# Patient Record
Sex: Female | Born: 1975 | Race: White | Hispanic: No | Marital: Married | Smoking: Never smoker
Health system: Southern US, Community
[De-identification: ages and names within clinical notes are randomized; demographics above are authoritative.]

## PROBLEM LIST (undated history)

## (undated) DIAGNOSIS — S83241A Other tear of medial meniscus, current injury, right knee, initial encounter: Secondary | ICD-10-CM

## (undated) DIAGNOSIS — Z87448 Personal history of other diseases of urinary system: Secondary | ICD-10-CM

## (undated) HISTORY — PX: APPENDECTOMY: SHX54

## (undated) HISTORY — DX: Personal history of other diseases of urinary system: Z87.448

---

## 2007-07-02 ENCOUNTER — Inpatient Hospital Stay (HOSPITAL_COMMUNITY): Admission: AD | Admit: 2007-07-02 | Discharge: 2007-07-05 | Payer: Self-pay | Admitting: Obstetrics and Gynecology

## 2007-09-01 ENCOUNTER — Inpatient Hospital Stay (HOSPITAL_COMMUNITY): Admission: AD | Admit: 2007-09-01 | Discharge: 2007-09-04 | Payer: Self-pay | Admitting: Obstetrics and Gynecology

## 2007-09-02 ENCOUNTER — Encounter (INDEPENDENT_AMBULATORY_CARE_PROVIDER_SITE_OTHER): Payer: Self-pay | Admitting: Obstetrics and Gynecology

## 2010-03-17 ENCOUNTER — Inpatient Hospital Stay (HOSPITAL_COMMUNITY): Admission: AD | Admit: 2010-03-17 | Payer: Self-pay | Admitting: Obstetrics and Gynecology

## 2010-04-25 ENCOUNTER — Encounter: Payer: Self-pay | Admitting: Family Medicine

## 2010-04-25 ENCOUNTER — Ambulatory Visit (INDEPENDENT_AMBULATORY_CARE_PROVIDER_SITE_OTHER): Payer: Commercial Managed Care - PPO | Admitting: Family Medicine

## 2010-04-25 VITALS — BP 122/75 | HR 101 | Wt 169.2 lb

## 2010-04-25 DIAGNOSIS — J029 Acute pharyngitis, unspecified: Secondary | ICD-10-CM | POA: Insufficient documentation

## 2010-04-25 MED ORDER — AMOXICILLIN 875 MG PO TABS: 875.0000 mg | ORAL_TABLET | Freq: Two times a day (BID) | ORAL | Status: AC

## 2010-04-25 NOTE — Patient Instructions (Signed)
Try the salt water gargles several times a day for relief.  Otherwise symptomatic treatment with Chloraseptic spray and throat drops are best.  I'll send in the Amoxicillin now.   If you symptoms haven't improved in 10 days, give [place] a call back. It was good to meet you!

## 2010-04-27 ENCOUNTER — Encounter: Payer: Self-pay | Admitting: Family Medicine

## 2010-04-27 NOTE — Assessment & Plan Note (Addendum)
Rapid strep negative, obtained throat culture.  Less likely to be post-nasal drip due to lack of sinus congestion. Not likely to be strep due to neg adenopathy, cough present.  However sore throat with exudate has persisted for greater than 1 week.   No pseudomembranes, up to date on vaccines so diphtheria unlikely.   Plan to treat with 1 week course of Amoxicillin despite negative strep.  Patient does have history of + rapid strep 1 year ago with similar presentation.   Gave red flags and reasons to fu/return to clinic.

## 2010-04-27 NOTE — Progress Notes (Signed)
  Subjective:    Patient ID: [name], female    DOB: [birth year], 35 y.o.   MRN: [id number]  HPI 1.  Sore throat:  35 yo G2P1001 who presents today with complaints of sore throat.  States that she has had cough for 1.5 weeks.  Developed sore throat 3 days ago.  Did not think much of it until looking in mirror and seeing exudate present.  Concerned for strep b/c she was diagnosed with strep throat about 1 year ago.  States that throat pain is present much of the time but worsens with swallowing.  No dysphagia or trouble with airway.  Cough basically nonproductive.  No fevers or chills.  No abdominal pain, N/V.   Review of Systems See HPI above for review of systems.       Objective:   Physical Exam Gen:  Alert, cooperative patient who appears stated age in no acute distress.  Vital signs reviewed. HEENT:  [place]/AT.  EOMI, PERRL.  Conjunctivae not injected.  MMM.  Tonsils with erythema and edema present.  Exudate present on Left tonsil that persists when patient swallows.    Neck:  No cervical adenopathy.   Pulm:  Clear to auscultation bilaterally with good air movement.  No wheezes or rales noted.   Cardiac:  Regular rate and rhythm without murmur auscultated.  Good S1/S2. Abd:  Fundal heights consistent with dates.  FHTs good.  Good bowel sounds.  No pain on palpation. Skin:  No rashes or lesions noted.     Assessment & Plan:

## 2010-04-29 ENCOUNTER — Encounter: Payer: Self-pay | Admitting: Family Medicine

## 2010-05-13 DIAGNOSIS — R05 Cough: Secondary | ICD-10-CM | POA: Insufficient documentation

## 2010-05-14 ENCOUNTER — Encounter: Payer: Self-pay | Admitting: Internal Medicine

## 2010-05-14 ENCOUNTER — Ambulatory Visit (INDEPENDENT_AMBULATORY_CARE_PROVIDER_SITE_OTHER)
Admission: RE | Admit: 2010-05-14 | Discharge: 2010-05-14 | Disposition: A | Discharge status: Home / Self Care | Payer: Commercial Managed Care - PPO | Source: Ambulatory Visit | Attending: Internal Medicine | Admitting: Internal Medicine

## 2010-05-14 ENCOUNTER — Other Ambulatory Visit: Payer: Self-pay | Admitting: Internal Medicine

## 2010-05-14 ENCOUNTER — Institutional Professional Consult (permissible substitution) (INDEPENDENT_AMBULATORY_CARE_PROVIDER_SITE_OTHER): Payer: Commercial Managed Care - PPO | Admitting: Internal Medicine

## 2010-05-14 DIAGNOSIS — R05 Cough: Secondary | ICD-10-CM

## 2010-05-14 DIAGNOSIS — R059 Cough, unspecified: Secondary | ICD-10-CM

## 2010-05-14 DIAGNOSIS — N390 Urinary tract infection, site not specified: Secondary | ICD-10-CM

## 2010-05-23 NOTE — Assessment & Plan Note (Signed)
Summary: Pulmonary consultaton/ cough ? all uacs   Visit Type:  Initial Consult Copy to:  Dr. Olivia Mackie Primary Provider/Referring Provider:  none  CC:  Cough.  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  Presently she denies any significant sore throat, dysphagia, itching, sneezing,   fever, chills, sweats, unintended wt loss, pleuritic or exertional cp, hempoptysis, change in activity tolerance  orthopnea pnd or leg swelling.   Pt also denies any obvious fluctuation in symptoms with weather or environmental change or other alleviating or aggravating factors.       Current Medications (verified): 1)  Macrobid 100 Mg Caps (Nitrofurantoin Monohyd Macro) .Marland Kitchen.. 1 Once Daily 2)  Prenatal Vitamins 0.8 Mg Tabs (Prenatal Multivit-Min-Fe-Fa) .Marland Kitchen.. 1 Once Daily 3)  Tylenol 325 Mg Tabs (Acetaminophen) .... Per Bottle Directions As Needed 4)  Tussionex Pennkinetic Er 10-8 Mg/34ml Lqcr (Hydrocod Polst-Chlorphen Polst) .Marland Kitchen.. 1 Tsp Two Times A Day As Needed  Allergies (verified): No Known Drug Allergies  Past History:  Past Medical History: Severe cough during 2nd pregnancy..............Marland KitchenWert Severe UTI during 2nd Pregnancy      - Macrodantin rx per Dr Rosemary Holms  Past Surgical History: Appendectomy 1993  Family History: Reviewed history from 05/13/2010 and no changes required. Emphysema- PGF Spinal cord CA- PGF Brain CA- PGM Prostate CA- MGF Heart dz- MGM and both Grandfathers TIA- MGF  Social History: Married Children Smoked briefly in college.  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  The patient denies shortness of breath with activity, shortness of breath at rest, coughing up blood, chest pain, irregular heartbeats, acid heartburn, loss of appetite, weight change, abdominal pain, difficulty swallowing, tooth/dental problems, sneezing, itching, ear ache, anxiety, depression, hand/feet swelling, joint stiffness or pain, rash, and fever.    Vital Signs:  Patient profile:   35 year old female Height:      66 inches Weight:      170 pounds BMI:     27.54 O2 Sat:      98 % on Room air Temp:     98.2 degrees F oral Pulse rate:   88 / minute BP sitting:   122 / 78  (left arm)  Vitals Entered By: Vernie Murders (May 14, 2010 9:52 AM)  O2 Flow:  Room air  Physical Exam  Additional Exam:  very healthy appearing amb wf with obvious nasal congestion and rattling cough/ pseudowheeze wt 170 May 14, 2010 HEENT: nl dentition,  and orophanx. Nl external ear canals without cough reflex NECK :  without JVD/Nodes/TM/ nl carotid upstrokes bilaterally LUNGS: no acc muscle use, clear to A and P bilaterally without cough on insp or exp maneuvers CV:  RRR  no s3 or murmur or increase in P2, no edema  ABD:  soft and nontender with nl excursion in the supine position. No bruits or organomegaly, bowel sounds nl MS:  warm without deformities, calf tenderness, cyanosis or clubbing SKIN: warm and dry without lesions   NEURO:  alert, approp, no deficits     CXR  Procedure date:  05/14/2010  Findings:      Comparison: None   Findings: The cardiomediastinal silhouette is unremarkable. The lungs are clear. There is no evidence of focal airspace disease, pulmonary edema, pulmonary nodule/mass, pleural effusion, or pneumothorax. No acute bony  abnormalities are identified.   IMPRESSION: No evidence of active cardiopulmonary disease.  Impression & Recommendations:  Problem # 1:  COUGH (ICD-786.2) The most common causes of chronic cough in immunocompetent adults include: upper airway cough syndrome (UACS), previously referred to as postnasal drip syndrome,  caused by variety of rhinosinus conditions; (2) asthma; (3) GERD; (4) chronic bronchitis from cigarette smoking or other inhaled environmental irritants; (5) nonasthmatic eosinophilic bronchitis; and (6) bronchiectasis. These conditions, singly or in combination, have accounted for up to 94% of the causes of chronic cough in prospective studies.  This is almost certainly a form of  Classic Upper airway cough syndrome, so named because it's frequently impossible to sort out how much is  CR/sinusitis with freq throat clearing (which can be related to primary GERD)   vs  causing  secondary extra esophageal GERD from wide swings in gastric pressure that occur with throat clearing, promoting self use of mint and menthol lozenges that reduce the lower esophageal sphincter tone and exacerbate the problem further These are the same pts who not infrequently have failed to tolerate ace inhibitors,  dry powder inhalers or biphosphonates or report having reflux symptoms that don't respond to standard doses of PPI and tend to cough during pregnancy which places her at high risk of gerd.  In this case this probably all sinusitis > cough > gerd > cough in cyclical pattern  (coughs so hard she vomits so clearly refluxing)  See instructions for specific recommendations   Problem # 2:  UTI (ICD-599.0)  Her updated medication list for this problem includes:    Macrobid 100 Mg Caps (Nitrofurantoin monohyd macro) .Marland Kitchen... 1 once daily    Augmentin 875-125 Mg Tabs (Amoxicillin-pot clavulanate) ..... By mouth twice daily  No evidence of pulmonary problem with use of macodantin to date.    Orders: Consultation Level V [phone])  Medications Added to Medication List This Visit: 1)  Macrobid 100 Mg Caps (Nitrofurantoin monohyd macro) .Marland Kitchen.. 1 once daily 2)  Prenatal Vitamins 0.8 Mg Tabs (Prenatal multivit-min-fe-fa) .Marland Kitchen.. 1 once daily 3)  Tylenol 325 Mg Tabs (Acetaminophen) .... Per bottle directions as needed 4)  Tussionex Pennkinetic Er 10-8 Mg/14ml Lqcr (Hydrocod polst-chlorphen polst) .Marland Kitchen.. 1 tsp two times a day as needed 5)  Augmentin 875-125 Mg Tabs (Amoxicillin-pot clavulanate) .... By mouth twice daily 6)  Nasonex 50 Mcg/act Susp (Mometasone furoate) .... Two puffs each nostril daily  Other Orders: T-2 View CXR ([id number])  Patient Instructions: 1)  I emphasized that nasal steroids have no immediate benefit in terms of improving symptoms.  To help them reached the target tissue, the patient should use Afrin two puffs every 12 hours applied one min before using the nasal steroids.  Afrin should be stopped after no more than 5 days.  If the symptoms worsen, Afrin can be restarted after 5 days off of therapy to prevent rebound congestion from overuse of Afrin.  Taper nasonex off once better 2)  Augmentin 875 twice daily with large glass of water and Yogurt for lunch 3)  Mucinex or Mucinex d  would help with the congestion 4)  Ranitidine 150mg  after bfast and bedtime until no longer coughing at all 5)  GERD (REFLUX)  is a common cause of respiratory symptoms. It commonly presents without heartburn and can be treated with medication, but also with lifestyle changes including avoidance of late meals, excessive alcohol, smoking cessation, and avoid fatty foods, chocolate, peppermint, colas, red wine, and acidic juices such as orange juice. NO MINT OR MENTHOL PRODUCTS SO NO COUGH DROPS  6)  USE SUGARLESS CANDY INSTEAD (jolley ranchers)  7)  NO OIL BASED VITAMINS  8)  Call if not improving by end of one week Prescriptions: AUGMENTIN 875-125 MG  TABS (AMOXICILLIN-POT CLAVULANATE) By  mouth twice daily  #20 x 0   Entered and Authorized by:   Nyoka Cowden MD   Signed by:   Nyoka Cowden MD on 05/14/2010   Method used:   Electronically to        [facility]* (retail)       [address]       [address] Shipping/mailing       [place], [place]  [address]       Ph: [phone]       Fax: [phone]   RxID:   [id number]

## 2010-07-30 NOTE — H&P (Signed)
[name], [name]             ACCOUNT NO.:  [id number]   MEDICAL RECORD NO.:  [id number]          PATIENT TYPE:  INP   LOCATION:  9169                          FACILITY:  [facility]   PHYSICIAN:  Lenoard Aden, M.D.DATE OF BIRTH:  04/24/1975   DATE OF ADMISSION:  09/01/2007  DATE OF DISCHARGE:                              HISTORY & PHYSICAL   CHIEF COMPLAINT:  Oligohydramnios.   She is a 35 year old white female, G2, P0, currently at 48 weeks'  gestation who presents with an AFI less than a 5th percentile for  induction.   MEDICATIONS:  Are prenatal vitamin and Macrobid for suppression.   She is a nonsmoker, nondrinker. Denies physical violence.   No known drug allergies. No known latex allergy.   She has a personal history of herpes zoster.  Pyelonephritis  hospitalized during this pregnancy, currently on Macrobid suppression.  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   FAMILY HISTORY:  Diabetes, breast cancer, rheumatoid arthritis, cancer,  stroke, osteoarthritis, heart disease, anemia, thrombophlebitis and  thyroid disease.   On physical exam, she is a well-developed, well-nourished, white female  in no acute distress.  HEENT:  Normal.  LUNGS:  Clear.  HEART:  Regular rhythm.  ABDOMEN:  Soft, gravid, nontender. Estimated fetal weight 6-1/2 pounds.  Cervix is closed, 50%, vertex and 0 station.  EXTREMITIES:  Reveal no clubbing, cyanosis, or edema.  NEUROLOGICAL:  Nonfocal.  SKIN:  Intact.   Cervidil is placed. NST is reactive.   IMPRESSION:  1. A 39-week intrauterine pregnancy.  2. Persistent oligohydramnios.  3. History of pregnancy associated pyelonephritis on Macrobid      suppression.   PLAN:  Proceed with Cervidil. Epidural as needed. Pitocin in the a.m.  Anticipate attempt at vaginal delivery.      Lenoard Aden, M.D.  Electronically Signed     RJT/MEDQ  D:  09/01/2007  T:  09/01/2007  Job:  [id number]

## 2010-07-30 NOTE — Op Note (Signed)
[name], [name]             ACCOUNT NO.:  [id number]   MEDICAL RECORD NO.:  [id number]          PATIENT TYPE:  INP   LOCATION:  9134                          [facility]ILITY:  [facility]   PHYSICIAN:  Lenoard Aden, M.D.DATE OF BIRTH:  [birth year]   DATE OF PROCEDURE:  DATE OF DISCHARGE:                               OPERATIVE REPORT   PREOPERATIVE DIAGNOSIS:  Indication for the delivery, deep variable  decelerations with late return.   POSTOPERATIVE DIAGNOSIS:  Indication for the delivery, deep variable  decelerations with late return.   PROCEDURE:  Outlet vacuum-assisted delivery x1.   SURGEON:  Lenoard Aden, M.D.   ANESTHESIA:  Epidural.   ESTIMATED BLOOD LOSS:  300 mL.   COMPLICATIONS:  None.   DRAINS:  None.   COUNTS:  Correct.   The patient and baby recovering in good condition.  Placenta to  pathology.  After being appraised of the risks of vacuum assistance  include small infant,  cephalohematoma, scalp laceration, and  intracranial hemorrhage, Kiwi cup was placed on the fetal vertex, +4  station, for 1 pull over second-degree midline laceration female from a  direct OA position, Apgars 8 and 9 as noted.  Placenta delivered  spontaneously, intact, 3-vessel cord.  No cervical or vaginal  lacerations noted.  Repair of perineal laceration with 3-0 Vicryl  [place].  Placenta sent to pathology.  Estimated blood loss as noted.  Placenta delivered spontaneously, intact 3-vessel cord.  Mother and baby  recovering in good condition.      Lenoard Aden, M.D.  Electronically Signed     RJT/MEDQ  D:  09/02/2007  T:  09/03/2007  Job:  [id number]

## 2010-08-02 NOTE — Discharge Summary (Signed)
[name], [name]             ACCOUNT NO.:  [id number]   MEDICAL RECORD NO.:  [id number]          PATIENT TYPE:  INP   LOCATION:  9160                          [facility]ILITY:  [facility]   PHYSICIAN:  Gerri Spore B. Earlene Plater, M.D.  DATE OF BIRTH:  18-Sep-1975   DATE OF ADMISSION:  07/02/2007  DATE OF DISCHARGE:  07/05/2007                               DISCHARGE SUMMARY   ADMISSION DIAGNOSES:  1. A 30-week intrauterine pregnancy.  2. Pyelonephritis.   DISCHARGE DIAGNOSES:  1. A 30-week intrauterine pregnancy.  2. Pyelonephritis.   HISTORY OF PRESENT ILLNESS:  A 34 year old [place] American female  presented to the office with urinary frequency, urgency, dysuria, and  flank pain.  She was also noted to have associated fever to 102 prior to  admission.  Urine was suspicious for UTI, and given her clinical  presentation, most likely the diagnosis was pyelonephritis.   HOSPITAL COURSE:  The patient was admitted and placed on intravenous  Rocephin and gentamicin while cultures were pending.  She defervesced,  and by the fourth hospital day, had been afebrile for 48 hours.  Culture  results were pending, but the patient had responded clinically, and was  therefore discharged home on Keflex 5 mg p.o. q.i.d. for 10 days and  follow up in the office in 1 week.   DISPOSITION:  Satisfactory.   DISCHARGE INSTRUCTIONS:  Call with any increased symptoms, fever, or  other problems.   DISCHARGE MEDICATIONS:  Keflex as above.      Gerri Spore B. Earlene Plater, M.D.  Electronically Signed     WBD/MEDQ  D:  08/12/2007  T:  08/13/2007  Job:  [id number]

## 2010-08-22 ENCOUNTER — Inpatient Hospital Stay (HOSPITAL_COMMUNITY)
Admission: RE | Admit: 2010-08-22 | Discharge: 2010-08-24 | DRG: 775 | Disposition: A | Discharge status: Home / Self Care | Payer: Commercial Managed Care - PPO | Source: Ambulatory Visit | Attending: Obstetrics and Gynecology | Admitting: Obstetrics and Gynecology

## 2010-08-22 DIAGNOSIS — O4100X0 Oligohydramnios, unspecified trimester, not applicable or unspecified: Principal | ICD-10-CM | POA: Diagnosis present

## 2010-08-22 DIAGNOSIS — O365990 Maternal care for other known or suspected poor fetal growth, unspecified trimester, not applicable or unspecified: Secondary | ICD-10-CM | POA: Diagnosis present

## 2010-08-22 LAB — CBC
HCT: 36.8 % (ref 36.0–46.0)
MCHC: 34.8 g/dL (ref 30.0–36.0)
MCV: 88.5 fL (ref 78.0–100.0)
RDW: 13.9 % (ref 11.5–15.5)

## 2010-08-23 LAB — CBC
MCH: 30.2 pg (ref 26.0–34.0)
MCV: 89.4 fL (ref 78.0–100.0)
Platelets: 182 10*3/uL (ref 150–400)
RDW: 14 % (ref 11.5–15.5)

## 2010-08-28 ENCOUNTER — Inpatient Hospital Stay (HOSPITAL_COMMUNITY): Admission: AD | Admit: 2010-08-28 | Payer: Self-pay | Source: Ambulatory Visit | Admitting: Obstetrics and Gynecology

## 2010-09-20 NOTE — H&P (Signed)
  NAMEDARLY, MASSI             ACCOUNT NO.:  [id number]  MEDICAL RECORD NO.:  [id number]  LOCATION:  9104                          [facility]ACILITY:  [facility]  PHYSICIAN:  Lenoard Aden, M.D.DATE OF BIRTH:  03/26/1975  DATE OF ADMISSION:  08/22/2010 DATE OF DISCHARGE:                             HISTORY & PHYSICAL   INDICATION FOR INDUCTION:  Oligohydramnios with questionable IUGR at 38+ weeks.  She is a 35 year old white female G2, P1 at 82 weeks' gestation, who presents with an AFI of 4.1 on ultrasound performed 24 hours ago.  She presents now for induction.  She has past medical history of induction for IUGR in her first pregnancy at 39 weeks and a TAB in 1997.  She has no known drug allergies.  Medications are prenatal vitamins.  She is a nonsmoker, nondrinker.  She denies domestic physical violence.  She has a personal history, remote history of HSV.  She has a family history remarkable for breast, prostate cancer, osteoarthritis, heart disease, COPD, rheumatoid arthritis, diabetes, prenatal course complicated by oligohydramnios with borderline SGA versus IUGR.  PHYSICAL EXAMINATION:  GENERAL:  She is a well-developed, well-nourished white female in no acute distress. HEENT:  Normal. NECK:  Supple, full range of motion. LUNGS:  Clear. HEART:  Regular rhythm. ABDOMEN:  Soft, gravid, nontender.  Estimated fetal weight 6.5-7 pounds. Cervix is 3 cm, 80%, vertex -1. EXTREMITIES:  No cords. NEUROLOGIC:  Nonfocal. SKIN:  Intact.  IMPRESSION:  A 39-week intrauterine pregnancy with persistent oligohydramnios.  PLAN:  Proceed with induction.     Lenoard Aden, M.D.     RJT/MEDQ  D:  08/22/2010  T:  08/23/2010  Job:  [id number]  Electronically Signed by Olivia Mackie M.D. on 09/20/2010 07:35:01 AM

## 2010-12-10 LAB — CBC
HCT: 28.3 — ABNORMAL LOW
Hemoglobin: 11.6 — ABNORMAL LOW
Hemoglobin: 9.9 — ABNORMAL LOW
RBC: 3 — ABNORMAL LOW
RBC: 3.5 — ABNORMAL LOW
RDW: 12.3
RDW: 12.6
WBC: 13.8 — ABNORMAL HIGH

## 2010-12-10 LAB — DIFFERENTIAL
Basophils Absolute: 0
Basophils Absolute: 0
Lymphocytes Relative: 5 — ABNORMAL LOW
Lymphocytes Relative: 8 — ABNORMAL LOW
Lymphs Abs: 1.1
Monocytes Absolute: 1.8 — ABNORMAL HIGH
Neutro Abs: 11 — ABNORMAL HIGH
Neutro Abs: 13.9 — ABNORMAL HIGH
Neutrophils Relative %: 80 — ABNORMAL HIGH
Neutrophils Relative %: 84 — ABNORMAL HIGH

## 2010-12-10 LAB — CREATININE, SERUM: GFR calc non Af Amer: >60

## 2010-12-10 LAB — CULTURE, BLOOD (ROUTINE X 2): Culture: NO GROWTH

## 2010-12-12 LAB — CBC
HCT: 35.6 — ABNORMAL LOW
HCT: 37.8
Hemoglobin: 12.3
Hemoglobin: 12.7
MCHC: 34.6
MCV: 94.3
RBC: 3.79 — ABNORMAL LOW
RBC: 4.01
RDW: 13.3
WBC: 17.1 — ABNORMAL HIGH

## 2011-02-11 ENCOUNTER — Ambulatory Visit (HOSPITAL_BASED_OUTPATIENT_CLINIC_OR_DEPARTMENT_OTHER)
Admission: RE | Admit: 2011-02-11 | Discharge: 2011-02-11 | Disposition: A | Discharge status: Home / Self Care | Payer: 59 | Source: Ambulatory Visit | Attending: Family Medicine | Admitting: Family Medicine

## 2011-02-11 ENCOUNTER — Encounter: Payer: Self-pay | Admitting: Family Medicine

## 2011-02-11 ENCOUNTER — Ambulatory Visit (INDEPENDENT_AMBULATORY_CARE_PROVIDER_SITE_OTHER): Payer: Commercial Managed Care - PPO | Admitting: Family Medicine

## 2011-02-11 VITALS — BP 131/89 | HR 69 | Temp 98.0°F | Ht 66.0 in | Wt 155.0 lb

## 2011-02-11 DIAGNOSIS — X500XXA Overexertion from strenuous movement or load, initial encounter: Secondary | ICD-10-CM | POA: Insufficient documentation

## 2011-02-11 DIAGNOSIS — S8263XA Displaced fracture of lateral malleolus of unspecified fibula, initial encounter for closed fracture: Secondary | ICD-10-CM | POA: Insufficient documentation

## 2011-02-11 DIAGNOSIS — S99929A Unspecified injury of unspecified foot, initial encounter: Secondary | ICD-10-CM

## 2011-02-11 DIAGNOSIS — S99912A Unspecified injury of left ankle, initial encounter: Secondary | ICD-10-CM

## 2011-02-11 DIAGNOSIS — M25579 Pain in unspecified ankle and joints of unspecified foot: Secondary | ICD-10-CM

## 2011-02-11 NOTE — Patient Instructions (Addendum)
You have a lateral malleolar avulsion fracture. Ice the area for 15 minutes at a time, 3-4 times a day Take aleve 1-2 tabs twice a day with food for 1 week then as needed. Elevate above the level of your heart when possible Crutches if needed to help with walking Bear weight when tolerated Depending on severity your doctor will place you in an ACE wrap with a walking boot OR a laceup ankle brace to help with stability while you recover from this injury. Come out of the boot/brace twice a day to do Up/down and alphabet exercises 2-3 sets of each - when pain is minimal, stop by and I'll give you the theraband to start strengthening exercises. Consider physical therapy for strengthening and balance exercises. If not improving as expected, we may repeat x-rays or consider further testing like an MRI.

## 2011-02-12 ENCOUNTER — Encounter: Payer: Self-pay | Admitting: Family Medicine

## 2011-02-12 DIAGNOSIS — S99912A Unspecified injury of left ankle, initial encounter: Secondary | ICD-10-CM | POA: Insufficient documentation

## 2011-02-12 NOTE — Progress Notes (Signed)
  Subjective:    Patient ID: [name], female    DOB: Feb 15, 1976, 35 y.o.   MRN: [id number]  PCP: not listed  HPI 35 yo [name] for left ankle injury.  Patient reports on 11/25 was walking outside in the driveway when she inverted left ankle on border between driveway and grass. Able to walk after this and bear weight but with a limp. + swelling and bruising. H/o prior ankle sprains to this ankle (on right side has had fibular avulsion fracture before). Has improved since the injury somewhat. Has an ASO and walking boot but not using - has them stored in house - going to check on if she can find these. Has been icing and taking ibuprofen.  History reviewed. No pertinent past medical history.  No current outpatient prescriptions on file prior to visit.    Past Surgical History  Procedure Date  . Appendectomy age 49  . Appendectomy     No Known Allergies  History   Social History  . Marital Status: Married    Spouse Name: N/A    Number of Children: N/A  . Years of Education: N/A   Occupational History  . 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 Smoking status: Never Smoker   . Smokeless tobacco: Not on file  . Alcohol Use: No  . Drug Use: No  . Sexually Active: Yes   Other Topics Concern  . Not on file   Social History Narrative  . No narrative on file    Family History  Problem Relation Age of Onset  . Cancer Neg Hx   . Asthma Neg Hx   . Alcohol abuse Neg Hx   . Drug abuse Neg Hx   . Hyperlipidemia Mother   . Hypertension Father   . Hyperlipidemia Maternal Grandmother   . Hyperlipidemia Maternal Grandfather   . Hypertension Maternal Grandfather   . Heart attack Maternal Grandfather   . Diabetes Paternal Grandmother   . Heart attack Paternal Grandfather     BP 131/89  Pulse 69  Temp(Src) 98 F (36.7 C) (Oral)  Ht 5\' 6"  (1.676 m)   Wt 155 lb (70.308 kg)  BMI 25.02 kg/m2  Review of Systems See HPI.    Objective:   Physical Exam Gen: NAD L ankle: Moderate swelling, bruising laterally.  No other deformity.  No erythema. Mod limitation in all planes of motion with pain laterally. TTP lateral malleolus and over ATFL.  No medial malleolus, fibular head, base 5th, navicular, or other TTP about foot or ankle. 2+ talar tilt, 1+ ant drawer. Pain laterally with syndesmotic compression. Thompsons test negative. NV intact distally.    Assessment & Plan:  1. Left ankle pain - radiographs consistent with small lateral malleolar avulsion fracture.  Treatment is similar to that of a grade 2-3 ankle sprain.  Start motion exercises, icing, aleve for pain and inflammation.  She is going to see if she can find her ASO - if not we will provide her with one to wear regularly for next 6 weeks.  Expect it to take her total of about 3-4 weeks before she feels mostly improved.  Advised her to stop by office in a couple weeks when pain has improved to get theraband and start strengthening exercises.  See instructions for further.

## 2011-02-12 NOTE — Assessment & Plan Note (Signed)
radiographs consistent with small lateral malleolar avulsion fracture.  Treatment is similar to that of a grade 2-3 ankle sprain.  Start motion exercises, icing, aleve for pain and inflammation.  She is going to see if she can find her ASO - if not we will provide her with one to wear regularly for next 6 weeks.  Expect it to take her total of about 3-4 weeks before she feels mostly improved.  Advised her to stop by office in a couple weeks when pain has improved to get theraband and start strengthening exercises.  See instructions for further.

## 2011-08-27 ENCOUNTER — Encounter: Payer: Self-pay | Admitting: Family

## 2011-08-27 ENCOUNTER — Ambulatory Visit (INDEPENDENT_AMBULATORY_CARE_PROVIDER_SITE_OTHER): Payer: 59 | Admitting: Family

## 2011-08-27 ENCOUNTER — Ambulatory Visit: Payer: Self-pay | Admitting: Family

## 2011-08-27 VITALS — BP 90/60 | HR 94 | Temp 98.2°F | Resp 16 | Wt 152.1 lb

## 2011-08-27 DIAGNOSIS — N61 Mastitis without abscess: Secondary | ICD-10-CM | POA: Insufficient documentation

## 2011-08-27 MED ORDER — CEPHALEXIN 500 MG PO CAPS: 500.0000 mg | ORAL_CAPSULE | Freq: Four times a day (QID) | ORAL | Status: AC

## 2011-08-27 NOTE — Progress Notes (Signed)
Subjective:    Patient ID: [name], female    DOB: 05/06/1975, 36 y.o.   MRN: [id number]  HPI  Ms.  Nielsen is a 36 yr old female who presents today to establish care.  She has chief complaint of left breast pain.  Pain started last night.  She is lactating.  She reports that she has associated malaise, chills, anorexia.  She did not take her temperature.  Symptoms are worsening.   Sore throat- last Thursday evening, improved. Had congestion over the weekend.   Review of Systems  Constitutional: Negative for unexpected weight change.  HENT: Positive for congestion.   Respiratory: Negative for cough and shortness of breath.   Cardiovascular: Negative for chest pain.  Gastrointestinal: Positive for nausea.  Genitourinary: Negative for urgency and frequency.  Musculoskeletal: Negative for myalgias and arthralgias.  Skin:       Redness left breast  Neurological: Positive for headaches.  Hematological: Negative for adenopathy.  Psychiatric/Behavioral:       Denies depression/anxiety   Past Medical History  Diagnosis Date  . H/O pyelonephritis 2001 & 2009    History   Social History  . Marital Status: Married    Spouse Name: N/A    Number of Children: 2  . Years of Education: N/A   Occupational History  . 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     Social History Main Topics  . Smoking status: Never Smoker   . Smokeless tobacco: Not on file  . Alcohol Use: No  . Drug Use: No  . Sexually Active: Yes   Other Topics Concern  . Not on file   Social History Narrative  . No narrative on file    Past Surgical History  Procedure Date  . Appendectomy age 65  . Appendectomy     Family History  Problem Relation Age of Onset  . Asthma Neg Hx   . Alcohol abuse Neg Hx   . Drug abuse Neg Hx   . Hyperlipidemia Mother   . Hypertension Father   . Thyroid disease Father    hypethyroidism  . Hyperlipidemia Maternal Grandmother   . Cancer Maternal Grandmother     breast  . Hyperlipidemia Maternal Grandfather   . Hypertension Maternal Grandfather   . Heart attack Maternal Grandfather   . Cancer Maternal Grandfather     prostate  . Heart disease Maternal Grandfather   . Stroke Maternal Grandfather   . Diabetes Paternal Grandmother   . Arthritis Paternal Grandmother   . Heart attack Paternal Grandfather     No Known Allergies  Current Outpatient Prescriptions on File Prior to Visit  Medication Sig Dispense Refill  . Prenat w/o A-FeCbn-DSS-FA-DHA (CITRANATAL 90 DHA PO) Take by mouth.          BP 90/60  Pulse 94  Temp 98.2 F (36.8 C) (Oral)  Resp 16  Wt 152 lb 1.3 oz (68.983 kg)  SpO2 99%  LMP 10/15/2009  Breastfeeding? Unknown        Objective:   Physical Exam  Constitutional: She is oriented to person, place, and time. She appears well-developed and well-nourished. No distress.  HENT:  Head: Normocephalic and atraumatic.  Right Ear: Tympanic membrane and ear canal normal.  Left Ear: Tympanic membrane and ear canal normal.  Mouth/Throat: No oropharyngeal exudate or posterior oropharyngeal edema.       Very mild pharyngeal erythema.  Neck: Normal range of motion. Neck supple.  Cardiovascular: Normal rate and regular rhythm.   No murmur heard. Pulmonary/Chest: Effort normal and breath sounds normal. No respiratory distress. She has no wheezes. She has no rales. She exhibits no tenderness.  Musculoskeletal: She exhibits no edema.  Neurological: She is alert and oriented to person, place, and time.  Skin: Skin is warm and dry. No rash noted. No erythema.       Left lateral breast erythema is noted, tender induration at 5 oclock- no discrete mass/abscess is noted.    Psychiatric: She has a normal mood and affect. Her behavior is normal. Judgment and thought content normal.          Assessment & Plan:

## 2011-08-27 NOTE — Assessment & Plan Note (Signed)
Will treat with keflex.  Warm compresses.  Pt encouraged to continue nursing.

## 2011-08-27 NOTE — Patient Instructions (Addendum)
Mastitis   Mastitis is a bacterial infection of the breast tissue.  CAUSES   Bacteria causes infection by entering the breast tissue through cuts or openings in the skin. Typically, this occurs with breastfeeding due to cracked or irritated skin. It can be associated with plugged ducts. Nipple piercing can also lead to mastitis.  SYMPTOMS   In mastitis, an area of the breast becomes swollen, red, tender, and painful. You may notice you have a fever and swelling of the glands under your arm on that side. If the infection is allowed to progress, a collection of pus (abscess) may develop.  DIAGNOSIS   Your caregiver can diagnose mastitis based on your symptoms and upon examination. The diagnosis can be confirmed if pus can be expressed from the breast. This pus can be examined in the lab to determine which bacteria are present. If an abscess has developed, the fluid in the abscess can be removed with a needle. This is used to confirm the diagnosis and determine the bacteria present. In most cases, pus will not be present. Blood tests can be done to determine if your body is fighting a bacterial infection. Sometimes, a mammogram or ultrasound will be recommended to exclude other breast diseases including cancer.  Other rare forms of mastitis:   Tuberculosis mastitis is rare. The TB germ can affect the breast if it is present in some other part of the body. The breast may be slightly tender with a mass, but not tender or painful.   Syphilis of the nipple usually has an ulcer that is not tender.   Actinomycosis is a very rare bacterial infection of the breast that presents as a mass in the breast that is not tender or painful.   Phlebitis (inflammation of blood vessels) of the breast is an inflammation of the veins in the breast. It may be caused by tight fitting bras, surgery, or trauma to the breast.   Inflammatory carcinoma of the breast looks like mastitis because the breasts are red, swollen, or tender, but it  is a rare form of breast cancer.  TREATMENT   Antibiotic medication is used to treat the bacterial infection. Your caregiver will determine which bacteria are most likely to be causing the infection and select an antibiotic. This is sometimes changed based on the results of cultures, or if there is no response to the antibiotic selected. Antibiotics are usually given by mouth. If you are breastfeeding, it is important to continue to empty the breast. Your caregiver can tell you whether or not this milk is safe for your infant, or needs to be thrown away. Pain can usually be treated with medication.  HOME CARE INSTRUCTIONS    Take your antibiotics as directed. Finish them even if you start to feel better.   Only take over-the-counter or prescription medication for pain, discomfort, or fever as directed by your caregiver.   If breastfeeding, keep your nipples clean and dry. Your caregiver may tell you to stop nursing until he or she feels it is safe for your baby. Use a breast pump as instructed if forced to stop nursing.   Do not wear a tight bra. Wear a good support bra.   Empty the first breast completely before going to the other breast. If your baby is not emptying your breasts completely for some reason, use a breast pump to empty your breasts.   If you go back to work, pump your breasts while at work to stay in time   with your nursing schedule.   Increase your fluid intake especially if you have a fever.   Avoid having your breasts get overly filled with milk (engorged).  SEEK MEDICAL CARE IF:    You develop pus-like (purulent) discharge from the breast.   Your symptoms get worse.   You do not seem to be responding to your treatment within 2 days.  SEEK IMMEDIATE MEDICAL CARE IF:    You have a fever.   Your pain and swelling is getting worse.   You develop pain that is not controlled with medicine.   You develop a red line extending from the breast toward your armpit.  Document Released:  03/03/2005 Document Revised: 02/20/2011 Document Reviewed: 10/22/2007  Exit[employer] Patient Information 2012 [employer].

## 2011-08-29 ENCOUNTER — Telehealth: Payer: Self-pay | Admitting: Family

## 2011-08-29 MED ORDER — VALACYCLOVIR HCL 1 G PO TABS: 1000.0000 mg | ORAL_TABLET | Freq: Every day | ORAL | Status: AC

## 2011-08-29 NOTE — Telephone Encounter (Signed)
Since my visit on Wednesday, I am feeling much, much better. However, because of the fever associated with the mastitis, I now have fever blisters developing. I have taken valtrex 1gm in the past , but my RX has expired. Would Melissa be willing to write a RX for Valtrex 1gm #30 TUD? Thanks.  [facility]

## 2011-08-29 NOTE — Telephone Encounter (Signed)
ok 

## 2011-08-29 NOTE — Telephone Encounter (Signed)
Rx sent to pharmacy   

## 2012-03-16 ENCOUNTER — Ambulatory Visit (HOSPITAL_BASED_OUTPATIENT_CLINIC_OR_DEPARTMENT_OTHER)
Admission: RE | Admit: 2012-03-16 | Discharge: 2012-03-16 | Disposition: A | Discharge status: Home / Self Care | Payer: 59 | Source: Ambulatory Visit | Attending: Family Medicine | Admitting: Family Medicine

## 2012-03-16 ENCOUNTER — Encounter: Payer: Self-pay | Admitting: Family Medicine

## 2012-03-16 ENCOUNTER — Ambulatory Visit (INDEPENDENT_AMBULATORY_CARE_PROVIDER_SITE_OTHER): Payer: 59 | Admitting: Family Medicine

## 2012-03-16 VITALS — BP 110/74 | HR 69 | Ht 67.0 in | Wt 147.0 lb

## 2012-03-16 DIAGNOSIS — M545 Low back pain, unspecified: Secondary | ICD-10-CM | POA: Insufficient documentation

## 2012-03-16 DIAGNOSIS — IMO0002 Reserved for concepts with insufficient information to code with codable children: Secondary | ICD-10-CM

## 2012-03-16 DIAGNOSIS — S3992XA Unspecified injury of lower back, initial encounter: Secondary | ICD-10-CM

## 2012-03-16 DIAGNOSIS — W19XXXA Unspecified fall, initial encounter: Secondary | ICD-10-CM | POA: Insufficient documentation

## 2012-03-18 ENCOUNTER — Encounter: Payer: Self-pay | Admitting: Family Medicine

## 2012-03-18 DIAGNOSIS — S3992XA Unspecified injury of lower back, initial encounter: Secondary | ICD-10-CM | POA: Insufficient documentation

## 2012-03-18 NOTE — Assessment & Plan Note (Signed)
radiographs today negative for acute fracture.  2/2 low back contusion and lumbar strain.  Discussed treatment options.  Will continue with ibuprofen.  If not improving over next 1-2 weeks will consider starting PT.  Declined pain medication, muscle relaxant at this time.  F/u prn otherwise.

## 2012-03-18 NOTE — Progress Notes (Signed)
Subjective:    Patient ID: [name], female    DOB: 20-Feb-1976, 37 y.o.   MRN: [id number]  PCP: Sandford Craze  HPI 37 yo [name] for low back injury.  Patient reports on 12/29 she accidentally slid coming down stairs and hit mid and right side low back on the steps, slid down 4 of them. Difficulty putting weight on this and felt more pressure on right side. No numbness or tingling. No bowel/bladder dysfunction. No prior h/o MRI, surgery, injections, PT on back. Ibuprofen has been helping. Feels a catching in right buttock at times. No radiation down leg though.  Past Medical History  Diagnosis Date  . H/O pyelonephritis 2001 & 2009    Current Outpatient Prescriptions on File Prior to Visit  Medication Sig Dispense Refill  . Prenat w/o A-FeCbn-DSS-FA-DHA (CITRANATAL 90 DHA PO) Take by mouth.        . valACYclovir (VALTREX) 1000 MG tablet Take 1 tablet (1,000 mg total) by mouth daily.  30 tablet  0    Past Surgical History  Procedure Date  . Appendectomy age 57  . Appendectomy     No Known Allergies  History   Social History  . Marital Status: Married    Spouse Name: N/A    Number of Children: 2  . Years of Education: N/A   Occupational History  . 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     Social History Main Topics  . Smoking status: Never Smoker   . Smokeless tobacco: Not on file  . Alcohol Use: No  . Drug Use: No  . Sexually Active: Yes   Other Topics Concern  . Not on file   Social History Narrative  . No narrative on file    Family History  Problem Relation Age of Onset  . Asthma Neg Hx   . Alcohol abuse Neg Hx   . Drug abuse Neg Hx   . Hyperlipidemia Mother   . Hypertension Father   . Thyroid disease Father     hypethyroidism  . Hyperlipidemia Maternal Grandmother   . Cancer Maternal Grandmother     breast  . Hyperlipidemia Maternal  Grandfather   . Hypertension Maternal Grandfather   . Heart attack Maternal Grandfather   . Cancer Maternal Grandfather     prostate  . Heart disease Maternal Grandfather   . Stroke Maternal Grandfather   . Diabetes Paternal Grandmother   . Arthritis Paternal Grandmother   . Heart attack Paternal Grandfather     BP 110/74  Pulse 69  Ht 5\' 7"  (1.702 m)  Wt 147 lb (66.679 kg)  BMI 23.02 kg/m2  LMP 02/21/2012  Review of Systems See HPI above.    Objective:   Physical Exam Gen: NAD  Back: No gross deformity, scoliosis. TTP mildly right paraspinal lumbar area and buttock.  No bony TTP. FROM without pain currently. Strength LEs 5/5 all muscle groups.   2+ MSRs in patellar and achilles tendons, equal bilaterally. Negative SLRs. Sensation intact to light touch bilaterally. Negative logroll bilateral hips Negative fabers and piriformis stretches.    Assessment & Plan:  1. Low back injury - radiographs today negative for acute fracture.  2/2 low back contusion and lumbar strain.  Discussed treatment options.  Will continue with ibuprofen.  If not improving over next 1-2 weeks will consider starting PT.  Declined pain medication, muscle relaxant at this  time.  F/u prn otherwise.

## 2012-10-08 ENCOUNTER — Other Ambulatory Visit: Payer: Self-pay | Admitting: *Deleted

## 2012-10-08 MED ORDER — VALACYCLOVIR HCL 1 G PO TABS: 1000.0000 mg | ORAL_TABLET | Freq: Every day | ORAL | Status: DC

## 2012-10-08 NOTE — Telephone Encounter (Signed)
Rx request to pharmacy [last filled 06.24.13]/SLS

## 2012-10-25 ENCOUNTER — Encounter: Payer: Self-pay | Admitting: Physician Assistant

## 2012-10-25 ENCOUNTER — Ambulatory Visit (INDEPENDENT_AMBULATORY_CARE_PROVIDER_SITE_OTHER): Payer: 59 | Admitting: Physician Assistant

## 2012-10-25 VITALS — BP 106/62 | HR 96 | Temp 98.5°F | Resp 14 | Ht 65.5 in | Wt 141.8 lb

## 2012-10-25 DIAGNOSIS — J029 Acute pharyngitis, unspecified: Secondary | ICD-10-CM

## 2012-10-25 NOTE — Patient Instructions (Signed)
Please drink plenty of fluids and get plenty of rest.  Saline nasal spray to help with any nasal congestion that may develop.  Warm liquids and Tylenol for sore throat.  Daily claritin or zyrtec over the next week will be of benefit.  Please call or RTC if symptoms worsen or persist past 10 days.  I hope you feel better.   Viral Infections A viral infection can be caused by different types of viruses.Most viral infections are not serious and resolve on their own. However, some infections may cause severe symptoms and may lead to further complications. SYMPTOMS Viruses can frequently cause:  Minor sore throat.  Aches and pains.  Headaches.  Runny nose.  Different types of rashes.  Watery eyes.  Tiredness.  Cough.  Loss of appetite.  Gastrointestinal infections, resulting in nausea, vomiting, and diarrhea. These symptoms do not respond to antibiotics because the infection is not caused by bacteria. However, you might catch a bacterial infection following the viral infection. This is sometimes called a "superinfection." Symptoms of such a bacterial infection may include:  Worsening sore throat with pus and difficulty swallowing.  Swollen neck glands.  Chills and a high or persistent fever.  Severe headache.  Tenderness over the sinuses.  Persistent overall ill feeling (malaise), muscle aches, and tiredness (fatigue).  Persistent cough.  Yellow, green, or brown mucus production with coughing. HOME CARE INSTRUCTIONS   Only take over-the-counter or prescription medicines for pain, discomfort, diarrhea, or fever as directed by your caregiver.  Drink enough water and fluids to keep your urine clear or pale yellow. Sports drinks can provide valuable electrolytes, sugars, and hydration.  Get plenty of rest and maintain proper nutrition. Soups and broths with crackers or rice are fine. SEEK IMMEDIATE MEDICAL CARE IF:   You have severe headaches, shortness of breath, chest  pain, neck pain, or an unusual rash.  You have uncontrolled vomiting, diarrhea, or you are unable to keep down fluids.  You or your child has an oral temperature above 102 F (38.9 C), not controlled by medicine.  Your baby is older than 3 months with a rectal temperature of 102 F (38.9 C) or higher.  Your baby is 57 months old or younger with a rectal temperature of 100.4 F (38 C) or higher. MAKE SURE YOU:   Understand these instructions.  Will watch your condition.  Will get help right away if you are not doing well or get worse. Document Released: 12/11/2004 Document Revised: 05/26/2011 Document Reviewed: 07/08/2010 [facility] Patient Information 2014 [place], [place].

## 2012-10-25 NOTE — Assessment & Plan Note (Signed)
Rest/Hydration.  Tylenol for pain.  Daily Claritin.  Can use saline nasal spray if nasal congestion develops.  Return to clinic if symptoms persist or acutely worsen.

## 2012-10-25 NOTE — Progress Notes (Signed)
Patient ID: [name], female   DOB: Nov 19, 1975, 37 y.o.   MRN: [id number]   Patient is a 37 year-old caucasian female who presents to clinic today c/o sore throat, headache and body aches since early this am.  Patient denies fever, cough, sob, abdominal pain, N/V/D/C.  Patient denies nasal congestion.  Patient denies recent sickness.  Denies seasonal allergies or asthma.     Past Medical History  Diagnosis Date  . H/O pyelonephritis 2001 & 2009   No current outpatient prescriptions on file prior to visit.   No current facility-administered medications on file prior to visit.   No Known Allergies  Family History  Problem Relation Age of Onset  . Asthma Neg Hx   . Alcohol abuse Neg Hx   . Drug abuse Neg Hx   . Hyperlipidemia Mother   . Hypertension Father   . Thyroid disease Father     hypethyroidism  . Hyperlipidemia Maternal Grandmother   . Cancer Maternal Grandmother     breast  . Hyperlipidemia Maternal Grandfather   . Hypertension Maternal Grandfather   . Heart attack Maternal Grandfather   . Cancer Maternal Grandfather     prostate  . Heart disease Maternal Grandfather   . Stroke Maternal Grandfather   . Diabetes Paternal Grandmother   . Arthritis Paternal Grandmother   . Heart attack Paternal Grandfather    History   Social History  . Marital Status: Married    Spouse Name: N/A    Number of Children: 2  . Years of Education: N/A   Occupational History  . 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     Social History Main Topics  . Smoking status: Never Smoker   . Smokeless tobacco: None  . Alcohol Use: No  . Drug Use: No  . Sexually Active: Yes   Other Topics Concern  . None   Social History Narrative  . None   Review of Systems  Constitutional: Positive for chills. Negative for fever, weight loss and malaise/fatigue.  HENT: Positive for ear pain and sore  throat. Negative for hearing loss, tinnitus and ear discharge.   Respiratory: Negative for cough, hemoptysis, sputum production, shortness of breath and wheezing.   Gastrointestinal: Negative for nausea, vomiting, abdominal pain, diarrhea and constipation.  Musculoskeletal: Positive for myalgias.  Neurological: Positive for headaches.    Filed Vitals:   10/25/12 0835  BP: 106/62  Pulse: 96  Temp: 98.5 F (36.9 C)  Resp: 14    Physical Exam  Vitals reviewed. Constitutional: She is oriented to person, place, and time and well-developed, well-nourished, and in no distress.  HENT:  Head: Normocephalic and atraumatic.  Right Ear: External ear normal.  Left Ear: External ear normal.  Nose: Nose normal.  Mouth/Throat: Oropharynx is clear and moist. No oropharyngeal exudate.  Eyes: Conjunctivae are normal. Pupils are equal, round, and reactive to light.  Neck: Normal range of motion.  Cardiovascular: Normal rate, regular rhythm and normal heart sounds.   Pulmonary/Chest: Effort normal and breath sounds normal. No respiratory distress. She has no wheezes. She has no rales. She exhibits no tenderness.  Lymphadenopathy:    She has no cervical adenopathy.  Neurological: She is alert and oriented to person, place, and time.  Skin: Skin is warm and dry. No rash noted.    Assessment/Plan: Acute viral pharyngitis Rest/Hydration.  Tylenol for pain.  Daily Claritin.  Can  use saline nasal spray if nasal congestion develops.  Return to clinic if symptoms persist or acutely worsen.

## 2012-10-26 ENCOUNTER — Encounter: Payer: Self-pay | Admitting: Physician Assistant

## 2012-10-26 ENCOUNTER — Ambulatory Visit (INDEPENDENT_AMBULATORY_CARE_PROVIDER_SITE_OTHER): Payer: 59 | Admitting: Physician Assistant

## 2012-10-26 ENCOUNTER — Telehealth: Payer: Self-pay | Admitting: Family

## 2012-10-26 VITALS — BP 112/68 | HR 113 | Temp 99.2°F | Resp 14 | Wt 138.5 lb

## 2012-10-26 DIAGNOSIS — R1310 Dysphagia, unspecified: Secondary | ICD-10-CM

## 2012-10-26 DIAGNOSIS — J020 Streptococcal pharyngitis: Secondary | ICD-10-CM

## 2012-10-26 DIAGNOSIS — J029 Acute pharyngitis, unspecified: Secondary | ICD-10-CM

## 2012-10-26 LAB — POCT RAPID STREP A (OFFICE): Rapid Strep A Screen: POSITIVE — AB

## 2012-10-26 MED ORDER — AMOXICILLIN 500 MG PO CAPS: 500.0000 mg | ORAL_CAPSULE | Freq: Three times a day (TID) | ORAL | Status: DC

## 2012-10-26 NOTE — Assessment & Plan Note (Signed)
Rx Amoxicillin 500 mg TID x 10 days.  Take all antibiotics.  Continue Ibuprofen for pain.  Encourage warm liquids.  Chloraseptic spray.  Call or RTC if symptoms persist.

## 2012-10-26 NOTE — Telephone Encounter (Signed)
Patient Information:  Caller Name: Selena Batten  Phone: [phone]  Patient: [name], [name]  Gender: Female  DOB: 11-21-1975  Age: 37 Years  PCP: Marcelline Mates "Selena Batten"  Pregnant: No  Office Follow Up:  Does the office need to follow up with this patient?: No  Instructions For The Office: N/A  RN Note:  Follow Up OV 10/25/2012. Onset of Severe Sore throat since yesterday 10/25/2012 and has been in bed with body aches. She can swallow liquids. She has tried gargling with Benadryl, mildly helpful and 800 mg. Ibuprofen TID for body aches. She is calling sore throat pain [facility]. RN/CAN called office and spoke with [facility] medical assistant and conferenced in. Trish scheduled appointment for today, 10/26/2012 @ 15:15.  Symptoms  Reason For Call & Symptoms: Follow up OV yesterday, 10/25/2012 Sore throat and body aches continuing.  Reviewed Health History In EMR: Yes  Reviewed Medications In EMR: Yes  Reviewed Allergies In EMR: Yes  Reviewed Surgeries / Procedures: N/A  Date of Onset of Symptoms: 10/24/2012  Treatments Tried: 800 mg Ibuprofen TID, she is a Teacher, early years/pre.  Treatments Tried Worked: No OB / GYN:  LMP: Unknown  Guideline(s) Used:  Sore Throat  Disposition Per Guideline:   See Today in Office  Reason For Disposition Reached:   Severe sore throat pain  Advice Given:  For Relief of Sore Throat Pain:  Sip warm chicken broth or apple juice.  Suck on hard candy or a throat lozenge (over-the-counter).  Gargle warm salt water 3 times daily (1 teaspoon of salt in 8 oz or 240 ml of warm water).  Avoid cigarette smoke.  Pain Medicines:  For pain relief, you can take either acetaminophen, ibuprofen, or naproxen.  They are over-the-counter (OTC) pain drugs. You can buy them at the drugstore.  Patient Will Follow Care Advice:  YES  Appointment Scheduled:  10/26/2012 15:15:00 Appointment Scheduled Provider:  Marcelline Mates "Selena Batten"

## 2012-10-26 NOTE — Progress Notes (Signed)
Patient ID: [name], female   DOB: Jan 07, 1976, 37 y.o.   MRN: [id number]   Patient presents to clinic c/o extreme sore throat, with tonsillar enlargement and pus in throat.  Patient was seen yesterday and diagnosed with viral pharyngitis.  Patient developed fever and tonsilar enlargement overnight.  Denies rash, N/V/C/D.  Her son is starting to have similar symptoms.  Past Medical History  Diagnosis Date  . H/O pyelonephritis 2001 & 2009   Current Outpatient Prescriptions on File Prior to Visit  Medication Sig Dispense Refill  . Multiple Vitamin (MULTIVITAMIN) tablet Take 1 tablet by mouth daily.      . valACYclovir (VALTREX) 1000 MG tablet Take 1,000 mg by mouth daily as needed.       No current facility-administered medications on file prior to visit.   No Known Allergies  Family History  Problem Relation Age of Onset  . Asthma Neg Hx   . Alcohol abuse Neg Hx   . Drug abuse Neg Hx   . Hyperlipidemia Mother   . Hypertension Father   . Thyroid disease Father     hypethyroidism  . Hyperlipidemia Maternal Grandmother   . Cancer Maternal Grandmother     breast  . Hyperlipidemia Maternal Grandfather   . Hypertension Maternal Grandfather   . Heart attack Maternal Grandfather   . Cancer Maternal Grandfather     prostate  . Heart disease Maternal Grandfather   . Stroke Maternal Grandfather   . Diabetes Paternal Grandmother   . Arthritis Paternal Grandmother   . Heart attack Paternal Grandfather    History   Social History  . Marital Status: Married    Spouse Name: N/A    Number of Children: 2  . Years of Education: N/A   Occupational History  . 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     Social History Main Topics  . Smoking status: Never Smoker   . Smokeless tobacco: None  . Alcohol Use: No  . Drug Use: No  . Sexually Active: Yes   Other Topics Concern  . None   Social  History Narrative  . None   Review of Systems  Constitutional: Positive for fever, chills and malaise/fatigue. Negative for weight loss.  HENT: Positive for congestion and sore throat. Negative for ear pain and ear discharge.   Respiratory: Positive for cough. Negative for sputum production, shortness of breath and wheezing.   Gastrointestinal: Negative for nausea, vomiting, abdominal pain, diarrhea and constipation.  Musculoskeletal: Positive for myalgias.  Skin: Negative for rash.  Neurological: Positive for headaches.   Filed Vitals:   10/26/12 1527  BP: 112/68  Pulse: 113  Temp: 99.2 F (37.3 C)  Resp: 14   Physical Exam  Constitutional: She is oriented to person, place, and time and well-developed, well-nourished, and in no distress.  HENT:  Head: Normocephalic and atraumatic.  Right Ear: External ear normal.  Left Ear: External ear normal.  TM WNL   Eyes: Conjunctivae are normal. Pupils are equal, round, and reactive to light.  Neck: Normal range of motion. Neck supple.  Pulmonary/Chest: Effort normal and breath sounds normal.  Lymphadenopathy:    She has cervical adenopathy.  Neurological: She is alert and oriented to person, place, and time.  Skin: Skin is warm and dry. No rash noted.    Diagnostics -- Rapid Strep +   Assessment/Plan: Strep pharyngitis Rx Amoxicillin 500 mg TID  x 10 days.  Take all antibiotics.  Continue Ibuprofen for pain.  Encourage warm liquids.  Chloraseptic spray.  Call or RTC if symptoms persist.

## 2012-10-26 NOTE — Patient Instructions (Signed)
Please take all antibiotic.  Continue Ibuprofen for pain.  Encourage salt water gargles, warm liquids, and chloraseptic spray.  I hope you feel better.   Strep Throat Strep throat is an infection of the throat caused by a bacteria named Streptococcus pyogenes. Your caregiver may call the infection streptococcal "tonsillitis" or "pharyngitis" depending on whether there are signs of inflammation in the tonsils or back of the throat. Strep throat is most common in children aged 5 15 years during the cold months of the year, but it can occur in people of any age during any season. This infection is spread from person to person (contagious) through coughing, sneezing, or other close contact. SYMPTOMS   Fever or chills.  Painful, swollen, red tonsils or throat.  Pain or difficulty when swallowing.  White or yellow spots on the tonsils or throat.  Swollen, tender lymph nodes or "glands" of the neck or under the jaw.  Red rash all over the body (rare). DIAGNOSIS  Many different infections can cause the same symptoms. A test must be done to confirm the diagnosis so the right treatment can be given. A "rapid strep test" can help your caregiver make the diagnosis in a few minutes. If this test is not available, a light swab of the infected area can be used for a throat culture test. If a throat culture test is done, results are usually available in a day or two. TREATMENT  Strep throat is treated with antibiotic medicine. HOME CARE INSTRUCTIONS   Gargle with 1 tsp of salt in 1 cup of warm water, 3 4 times per day or as needed for comfort.  Family members who also have a sore throat or fever should be tested for strep throat and treated with antibiotics if they have the strep infection.  Make sure everyone in your household washes their hands well.  Do not share food, drinking cups, or personal items that could cause the infection to spread to others.  You may need to eat a soft food diet until  your sore throat gets better.  Drink enough water and fluids to keep your urine clear or pale yellow. This will help prevent dehydration.  Get plenty of rest.  Stay home from school, daycare, or work until you have been on antibiotics for 24 hours.  Only take over-the-counter or prescription medicines for pain, discomfort, or fever as directed by your caregiver.  If antibiotics are prescribed, take them as directed. Finish them even if you start to feel better. SEEK MEDICAL CARE IF:   The glands in your neck continue to enlarge.  You develop a rash, cough, or earache.  You cough up green, yellow-brown, or bloody sputum.  You have pain or discomfort not controlled by medicines.  Your problems seem to be getting worse rather than better. SEEK IMMEDIATE MEDICAL CARE IF:   You develop any new symptoms such as vomiting, severe headache, stiff or painful neck, chest pain, shortness of breath, or trouble swallowing.  You develop severe throat pain, drooling, or changes in your voice.  You develop swelling of the neck, or the skin on the neck becomes red and tender.  You have a fever.  You develop signs of dehydration, such as fatigue, dry mouth, and decreased urination.  You become increasingly sleepy, or you cannot wake up completely. Document Released: 02/29/2000 Document Revised: 02/18/2012 Document Reviewed: 05/02/2010 [facility] Patient Information 2014 [place], [place].

## 2013-01-20 ENCOUNTER — Other Ambulatory Visit: Payer: Self-pay

## 2013-06-23 LAB — HM PAP SMEAR: HM PAP: NORMAL

## 2013-06-28 ENCOUNTER — Ambulatory Visit (INDEPENDENT_AMBULATORY_CARE_PROVIDER_SITE_OTHER): Payer: 59 | Admitting: Family Medicine

## 2013-06-28 ENCOUNTER — Encounter: Payer: Self-pay | Admitting: Family Medicine

## 2013-06-28 VITALS — BP 132/80 | HR 78 | Temp 98.6°F | Ht 66.0 in | Wt 139.1 lb

## 2013-06-28 DIAGNOSIS — J029 Acute pharyngitis, unspecified: Secondary | ICD-10-CM

## 2013-06-28 LAB — POCT RAPID STREP A (OFFICE): Rapid Strep A Screen: NEGATIVE

## 2013-06-28 MED ORDER — AMOXICILLIN 500 MG PO CAPS: 500.0000 mg | ORAL_CAPSULE | Freq: Three times a day (TID) | ORAL | Status: DC

## 2013-06-28 NOTE — Progress Notes (Signed)
Pre visit review using our clinic review tool, if applicable. No additional management support is needed unless otherwise documented below in the visit note. 

## 2013-06-28 NOTE — Patient Instructions (Signed)
Pharyngitis °Pharyngitis is redness, pain, and swelling (inflammation) of your pharynx.  °CAUSES  °Pharyngitis is usually caused by infection. Most of the time, these infections are from viruses (viral) and are part of a cold. However, sometimes pharyngitis is caused by bacteria (bacterial). Pharyngitis can also be caused by allergies. Viral pharyngitis may be spread from person to person by coughing, sneezing, and personal items or utensils (cups, forks, spoons, toothbrushes). Bacterial pharyngitis may be spread from person to person by more intimate contact, such as kissing.  °SIGNS AND SYMPTOMS  °Symptoms of pharyngitis include:   °· Sore throat.   °· Tiredness (fatigue).   °· Low-grade fever.   °· Headache. °· Joint pain and muscle aches. °· Skin rashes. °· Swollen lymph nodes. °· Plaque-like film on throat or tonsils (often seen with bacterial pharyngitis). °DIAGNOSIS  °Your health care provider will ask you questions about your illness and your symptoms. Your medical history, along with a physical exam, is often all that is needed to diagnose pharyngitis. Sometimes, a rapid strep test is done. Other lab tests may also be done, depending on the suspected cause.  °TREATMENT  °Viral pharyngitis will usually get better in 3 4 days without the use of medicine. Bacterial pharyngitis is treated with medicines that kill germs (antibiotics).  °HOME CARE INSTRUCTIONS  °· Drink enough water and fluids to keep your urine clear or pale yellow.   °· Only take over-the-counter or prescription medicines as directed by your health care provider:   °· If you are prescribed antibiotics, make sure you finish them even if you start to feel better.   °· Do not take aspirin.   °· Get lots of rest.   °· Gargle with 8 oz of salt water (½ tsp of salt per 1 qt of water) as often as every 1 2 hours to soothe your throat.   °· Throat lozenges (if you are not at risk for choking) or sprays may be used to soothe your throat. °SEEK MEDICAL  CARE IF:  °· You have large, tender lumps in your neck. °· You have a rash. °· You cough up green, yellow-brown, or bloody spit. °SEEK IMMEDIATE MEDICAL CARE IF:  °· Your neck becomes stiff. °· You drool or are unable to swallow liquids. °· You vomit or are unable to keep medicines or liquids down. °· You have severe pain that does not go away with the use of recommended medicines. °· You have trouble breathing (not caused by a stuffy nose). °MAKE SURE YOU:  °· Understand these instructions. °· Will watch your condition. °· Will get help right away if you are not doing well or get worse. °Document Released: 03/03/2005 Document Revised: 12/22/2012 Document Reviewed: 11/08/2012 °Exit[employer]® Patient Information ©2014 [employer]. ° °

## 2013-07-03 NOTE — Assessment & Plan Note (Signed)
Rapid strep negative. Encouraged increased rest and hydration, add probiotics, zinc such as Coldeze or Xicam. Treat fevers as needed. Given Amoxicillinto use only if symptoms worsen or do not resolve

## 2013-07-03 NOTE — Progress Notes (Signed)
Patient ID: [name], female   DOB: May 20, 1975, 38 y.o.   MRN: [id number] [name] [id number] 1975-09-23 07/03/2013      Progress Note-Follow Up  Subjective  Chief Complaint  Chief Complaint  Patient presents with  . Sore Throat    possible strep? pts son has strep    HPI  Patient is a 38 year old female in today for routine medical care. She is noting day 2 of throat discomfort, fatigue, myalgias and chills. Has been using Ibuprofen to manage symptoms with some temporary improvement. No significant congestion, ear pain. Denies CP/palp/SOB/HA/congestion/fevers/GI or GU c/o.  Past Medical History  Diagnosis Date  . H/O pyelonephritis 2001 & 2009    Past Surgical History  Procedure Laterality Date  . Appendectomy  age 4  . Appendectomy      Family History  Problem Relation Age of Onset  . Asthma Neg Hx   . Alcohol abuse Neg Hx   . Drug abuse Neg Hx   . Hyperlipidemia Mother   . Hypertension Father   . Thyroid disease Father     hypethyroidism  . Hyperlipidemia Maternal Grandmother   . Cancer Maternal Grandmother     breast  . Hyperlipidemia Maternal Grandfather   . Hypertension Maternal Grandfather   . Heart attack Maternal Grandfather   . Cancer Maternal Grandfather     prostate  . Heart disease Maternal Grandfather   . Stroke Maternal Grandfather   . Diabetes Paternal Grandmother   . Arthritis Paternal Grandmother   . Heart attack Paternal Grandfather     History   Social History  . Marital Status: Married    Spouse Name: N/A    Number of Children: 2  . Years of Education: N/A   Occupational History  . 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     Social History Main Topics  . Smoking status: Never Smoker   . Smokeless tobacco: Not on file  . Alcohol Use: No  . Drug Use: No  . Sexual Activity: Yes   Other Topics Concern  . Not on file   Social  History Narrative  . No narrative on file    Current Outpatient Prescriptions on File Prior to Visit  Medication Sig Dispense Refill  . Multiple Vitamin (MULTIVITAMIN) tablet Take 1 tablet by mouth daily.      . valACYclovir (VALTREX) 1000 MG tablet Take 1,000 mg by mouth daily as needed.       No current facility-administered medications on file prior to visit.    No Known Allergies  Review of Systems  Review of Systems  Constitutional: Positive for chills and malaise/fatigue. Negative for fever.  HENT: Positive for sore throat. Negative for congestion.   Eyes: Negative for discharge.  Respiratory: Negative for shortness of breath.   Cardiovascular: Negative for chest pain, palpitations and leg swelling.  Gastrointestinal: Negative for nausea, abdominal pain and diarrhea.  Genitourinary: Negative for dysuria.  Musculoskeletal: Positive for myalgias. Negative for falls.  Skin: Negative for rash.  Neurological: Negative for loss of consciousness and headaches.  Endo/Heme/Allergies: Negative for polydipsia.  Psychiatric/Behavioral: Negative for depression and suicidal ideas. The patient is not nervous/anxious and does not have insomnia.     Objective  BP 132/80  Pulse 78  Temp(Src) 98.6 F (37 C) (Oral)  Ht 5\' 6"  (1.676 m)  Wt 139 lb 1.9 oz (63.104 kg)  BMI 22.47  kg/m2  SpO2 97%  LMP 06/17/2013  Physical Exam  Physical Exam  Constitutional: She is oriented to person, place, and time and well-developed, well-nourished, and in no distress. No distress.  HENT:  Head: Normocephalic and atraumatic.  Oropharynx erythematous no exudate or swelling.   Eyes: Conjunctivae are normal.  Neck: Neck supple. No thyromegaly present.  Cardiovascular: Normal rate, regular rhythm and normal heart sounds.   No murmur heard. Pulmonary/Chest: Effort normal and breath sounds normal. She has no wheezes.  Abdominal: She exhibits no distension and no mass.  Musculoskeletal: She exhibits  no edema.  Lymphadenopathy:    She has no cervical adenopathy.  Neurological: She is alert and oriented to person, place, and time.  Skin: Skin is warm and dry. No rash noted. She is not diaphoretic.  Psychiatric: Memory, affect and judgment normal.    No results found for this basename: TSH   Lab Results  Component Value Date   WBC 15.6* 08/23/2010   HGB 12.5 08/23/2010   HCT 37.0 08/23/2010   MCV 89.4 08/23/2010   PLT 182 08/23/2010   Lab Results  Component Value Date   CREATININE 0.66 07/03/2007   No results found for this basename: ALT, AST, GGT, ALKPHOS, BILITOT   No results found for this basename: CHOL   No results found for this basename: HDL   No results found for this basename: LDLCALC   No results found for this basename: TRIG   No results found for this basename: CHOLHDL     Assessment & Plan  Pharyngitis Rapid strep negative. Encouraged increased rest and hydration, add probiotics, zinc such as Coldeze or Xicam. Treat fevers as needed. Given Amoxicillinto use only if symptoms worsen or do not resolve

## 2013-07-04 ENCOUNTER — Telehealth: Payer: Self-pay

## 2013-07-04 NOTE — Telephone Encounter (Signed)
Note sent through mychart stating strep was negative

## 2013-07-22 ENCOUNTER — Encounter: Payer: Self-pay | Admitting: Family Medicine

## 2014-01-16 ENCOUNTER — Encounter: Payer: Self-pay | Admitting: Family Medicine

## 2014-10-24 ENCOUNTER — Telehealth: Payer: Self-pay | Admitting: Family

## 2014-10-24 NOTE — Telephone Encounter (Signed)
Caller name: Kaitlin Relation to pt: self Call back Diamond:  Reason for call: Pt came in office stating wanting to have her CPE done before Sept 1, 2016. Pt states can make arrangement at work for any hour before Sept 1, 2016. Melissa schedule is full for cpe but does have two slots on November 13, 2014.  Please advise.

## 2014-10-25 NOTE — Telephone Encounter (Signed)
We could combine 2 slots beginning at 8:15am on 11/13/14. That would give [place] 5 (33minute slots) that morning but none of them will be back to back. Please advise?

## 2014-10-25 NOTE — Telephone Encounter (Signed)
OK 

## 2014-10-25 NOTE — Telephone Encounter (Signed)
Notified pt. 

## 2014-10-25 NOTE — Telephone Encounter (Signed)
Scheduled pt on 11/13/14 at 8:15am. Pt not available at this time. Will try work # later today.

## 2014-11-02 ENCOUNTER — Telehealth: Payer: Self-pay | Admitting: Family

## 2014-11-02 NOTE — Telephone Encounter (Signed)
pre visit letter mailed 10/23/14

## 2014-11-10 ENCOUNTER — Telehealth: Payer: Self-pay | Admitting: Behavioral Health

## 2014-11-10 ENCOUNTER — Encounter: Payer: Self-pay | Admitting: Behavioral Health

## 2014-11-10 NOTE — Telephone Encounter (Signed)
Pre-Visit Call completed with patient and chart updated.   Pre-Visit Info documented in Specialty Comments under [employer].    

## 2014-11-13 ENCOUNTER — Encounter: Payer: Self-pay | Admitting: Family

## 2014-11-13 ENCOUNTER — Ambulatory Visit (INDEPENDENT_AMBULATORY_CARE_PROVIDER_SITE_OTHER): Payer: 59 | Admitting: Family

## 2014-11-13 VITALS — BP 130/70 | HR 68 | Temp 98.2°F | Resp 16 | Ht 65.25 in | Wt 135.8 lb

## 2014-11-13 DIAGNOSIS — Z0000 Encounter for general adult medical examination without abnormal findings: Secondary | ICD-10-CM | POA: Insufficient documentation

## 2014-11-13 LAB — CBC WITH DIFFERENTIAL/PLATELET
BASOS PCT: 0.6 % (ref 0.0–3.0)
Basophils Absolute: 0 10*3/uL (ref 0.0–0.1)
EOS ABS: 0 10*3/uL (ref 0.0–0.7)
Eosinophils Relative: 0.6 % (ref 0.0–5.0)
HEMATOCRIT: 38.6 % (ref 36.0–46.0)
Hemoglobin: 12.9 g/dL (ref 12.0–15.0)
LYMPHS PCT: 32 % (ref 12.0–46.0)
Lymphs Abs: 1.7 10*3/uL (ref 0.7–4.0)
MCHC: 33.6 g/dL (ref 30.0–36.0)
MCV: 92.3 fl (ref 78.0–100.0)
Monocytes Absolute: 0.4 10*3/uL (ref 0.1–1.0)
Monocytes Relative: 7.6 % (ref 3.0–12.0)
NEUTROS ABS: 3.1 10*3/uL (ref 1.4–7.7)
Neutrophils Relative %: 59.2 % (ref 43.0–77.0)
PLATELETS: 239 10*3/uL (ref 150.0–400.0)
RBC: 4.18 Mil/uL (ref 3.87–5.11)
RDW: 14.2 % (ref 11.5–15.5)
WBC: 5.3 10*3/uL (ref 4.0–10.5)

## 2014-11-13 LAB — URINALYSIS, ROUTINE W REFLEX MICROSCOPIC
Bilirubin Urine: NEGATIVE
Hgb urine dipstick: NEGATIVE
KETONES UR: NEGATIVE
LEUKOCYTES UA: NEGATIVE
Nitrite: NEGATIVE
PH: 6 (ref 5.0–8.0)
RBC / HPF: NONE SEEN (ref 0–?)
SPECIFIC GRAVITY, URINE: 1.02 (ref 1.000–1.030)
TOTAL PROTEIN, URINE-UPE24: NEGATIVE
URINE GLUCOSE: NEGATIVE
UROBILINOGEN UA: 0.2 (ref 0.0–1.0)
WBC, UA: NONE SEEN (ref 0–?)

## 2014-11-13 LAB — BASIC METABOLIC PANEL
BUN: 10 mg/dL (ref 6–23)
CHLORIDE: 101 meq/L (ref 96–112)
CO2: 27 meq/L (ref 19–32)
Calcium: 9.7 mg/dL (ref 8.4–10.5)
Creatinine, Ser: 0.87 mg/dL (ref 0.40–1.20)
GFR: 76.63 mL/min (ref >60.00)
GLUCOSE: 95 mg/dL (ref 70–99)
POTASSIUM: 3.6 meq/L (ref 3.5–5.1)
Sodium: 136 mEq/L (ref 135–145)

## 2014-11-13 LAB — LIPID PANEL
Cholesterol: 186 mg/dL (ref 0–200)
HDL: 69.8 mg/dL (ref >39.00)
LDL CALC: 104 mg/dL — AB (ref 0–99)
NONHDL: 115.98
Total CHOL/HDL Ratio: 3
Triglycerides: 59 mg/dL (ref 0.0–149.0)
VLDL: 11.8 mg/dL (ref 0.0–40.0)

## 2014-11-13 LAB — HEPATIC FUNCTION PANEL
ALBUMIN: 4.4 g/dL (ref 3.5–5.2)
ALT: 11 U/L (ref 0–35)
AST: 17 U/L (ref 0–37)
Alkaline Phosphatase: 39 U/L (ref 39–117)
BILIRUBIN DIRECT: 0.2 mg/dL (ref 0.0–0.3)
TOTAL PROTEIN: 7.3 g/dL (ref 6.0–8.3)
Total Bilirubin: 0.8 mg/dL (ref 0.2–1.2)

## 2014-11-13 LAB — TSH: TSH: 1.36 u[IU]/mL (ref 0.35–4.50)

## 2014-11-13 NOTE — Patient Instructions (Signed)
Please complete lab work prior to leaving. Follow up in 1 year.

## 2014-11-13 NOTE — Assessment & Plan Note (Signed)
Immunizations reviewed and up to date.  Continue healthy diet, exercise.  Obtain routine labs. Refer for mammogram.

## 2014-11-13 NOTE — Progress Notes (Signed)
Pre visit review using our clinic review tool, if applicable. No additional management support is needed unless otherwise documented below in the visit note. 

## 2014-11-13 NOTE — Progress Notes (Signed)
Subjective:    Patient ID: [name], female    DOB: 11/24/1975, 39 y.o.   MRN: [id number]  HPI  [name] is a 39 yr old female who presents today for cpx.  Immunizations: up to date.  Diet: healthy diet.  Wt Readings from Last 3 Encounters:  11/13/14 135 lb 12.8 oz (61.598 kg)  06/28/13 139 lb 1.9 oz (63.104 kg)  10/26/12 138 lb 8 oz (62.823 kg)  Exercise: runs every other day.  Pap Smear: 10/14- GYN Mammogram: due     Review of Systems  Constitutional: Negative for unexpected weight change.  HENT: Negative for hearing loss and rhinorrhea.   Eyes: Negative for visual disturbance.  Respiratory: Negative for cough and shortness of breath.   Cardiovascular: Negative for chest pain and leg swelling.  Gastrointestinal: Negative for nausea, diarrhea and blood in stool.  Genitourinary: Negative for dysuria and frequency.  Musculoskeletal: Negative for joint swelling and arthralgias.       Some left shoulder pain- will see sports med  Skin: Negative for rash.  Neurological: Negative for headaches.  Hematological: Negative for adenopathy.  Psychiatric/Behavioral:       Denies depression/anxiety   Past Medical History  Diagnosis Date  . H/O pyelonephritis 2001 & 2009    Social History   Social History  . Marital Status: Married    Spouse Name: N/A  . Number of Children: 2  . Years of Education: N/A   Occupational History  . 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     Social History Main Topics  . Smoking status: Never Smoker   . Smokeless tobacco: Not on file  . Alcohol Use: 1.2 oz/week    2 Glasses of wine per week  . Drug Use: No  . Sexual Activity: Yes   Other Topics Concern  . 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      Past Surgical History  Procedure Laterality Date  . Appendectomy  age 55    Family History  Problem Relation Age of Onset  . Asthma Neg Hx   . Alcohol abuse Neg Hx   . Drug abuse Neg Hx   . Hyperlipidemia Mother   . Hypertension Father   . Thyroid disease Father     hypethyroidism  . Hyperlipidemia Maternal Grandmother   . Cancer Maternal Grandmother     breast  . Hyperlipidemia Maternal Grandfather   . Hypertension Maternal Grandfather   . Heart attack Maternal Grandfather   . Cancer Maternal Grandfather     prostate  . Heart disease Maternal Grandfather   . Stroke Maternal Grandfather   . Diabetes Paternal Grandmother   . Arthritis Paternal Grandmother   . Heart attack Paternal Grandfather     Allergies  Allergen Reactions  . Tdap [Diphth-Acell Pertussis-Tetanus] Swelling    "Lot of swelling and scar tissue at injection site"    No current outpatient prescriptions on file prior to visit.   No current facility-administered medications on file prior to visit.    BP 130/70 mmHg  Pulse 68  Temp(Src) 98.2 F (36.8 C) (Oral)  Resp 16  Ht 5' 5.25" (1.657 m)  Wt 135 lb 12.8 oz (61.598 kg)  BMI 22.43 kg/m2  SpO2 98%  LMP 10/20/2014       Objective:   Physical Exam  Physical Exam  Constitutional: She is oriented to person, place, and time. She appears well-developed and well-nourished. No distress.  HENT:  Head: Normocephalic and atraumatic.  Right Ear: Tympanic membrane and ear canal normal.  Left Ear: Tympanic membrane and ear canal normal.  Mouth/Throat: Oropharynx is clear and moist.  Eyes: Pupils are equal, round, and reactive to light. No scleral icterus.  Neck: Normal range of motion. No thyromegaly present.  Cardiovascular: Normal rate and regular rhythm.   No murmur heard. Pulmonary/Chest: Effort normal and breath sounds normal. No respiratory distress. He has no wheezes. She has no rales. She exhibits no tenderness.  Abdominal: Soft. Bowel  sounds are normal. He exhibits no distension and no mass. There is no tenderness. There is no rebound and no guarding.  Musculoskeletal: She exhibits no edema.  Lymphadenopathy:    She has no cervical adenopathy.  Neurological: She is alert and oriented to person, place, and time.  She exhibits normal muscle tone. Coordination normal.  Skin: Skin is warm and dry.  Psychiatric: She has a normal mood and affect. Her behavior is normal. Judgment and thought content normal.  Breasts: Examined lying Right: Without masses, retractions, discharge or axillary adenopathy.  Left: Without masses, retractions, discharge or axillary adenopathy.          Assessment & Plan:         Assessment & Plan:

## 2014-11-24 ENCOUNTER — Ambulatory Visit (HOSPITAL_BASED_OUTPATIENT_CLINIC_OR_DEPARTMENT_OTHER)
Admission: RE | Admit: 2014-11-24 | Discharge: 2014-11-24 | Disposition: A | Discharge status: Home / Self Care | Payer: 59 | Source: Ambulatory Visit | Attending: Family | Admitting: Family

## 2014-11-24 DIAGNOSIS — Z1231 Encounter for screening mammogram for malignant neoplasm of breast: Secondary | ICD-10-CM | POA: Insufficient documentation

## 2014-11-24 DIAGNOSIS — R928 Other abnormal and inconclusive findings on diagnostic imaging of breast: Secondary | ICD-10-CM | POA: Diagnosis not present

## 2014-11-24 DIAGNOSIS — Z0000 Encounter for general adult medical examination without abnormal findings: Secondary | ICD-10-CM

## 2014-11-27 ENCOUNTER — Other Ambulatory Visit: Payer: Self-pay | Admitting: Family

## 2014-11-27 DIAGNOSIS — N6489 Other specified disorders of breast: Secondary | ICD-10-CM

## 2014-12-04 ENCOUNTER — Ambulatory Visit
Admission: RE | Admit: 2014-12-04 | Discharge: 2014-12-04 | Disposition: A | Discharge status: Home / Self Care | Payer: 59 | Source: Ambulatory Visit | Attending: Family | Admitting: Family

## 2014-12-04 ENCOUNTER — Encounter: Payer: Self-pay | Admitting: Family

## 2014-12-04 ENCOUNTER — Other Ambulatory Visit: Payer: Self-pay | Admitting: Family

## 2014-12-04 DIAGNOSIS — N6489 Other specified disorders of breast: Secondary | ICD-10-CM

## 2015-01-09 ENCOUNTER — Ambulatory Visit (INDEPENDENT_AMBULATORY_CARE_PROVIDER_SITE_OTHER): Payer: 59 | Admitting: Family Medicine

## 2015-01-09 ENCOUNTER — Encounter: Payer: Self-pay | Admitting: Family Medicine

## 2015-01-09 VITALS — BP 134/79 | HR 69 | Ht 66.0 in

## 2015-01-09 DIAGNOSIS — M25512 Pain in left shoulder: Secondary | ICD-10-CM | POA: Diagnosis not present

## 2015-01-09 NOTE — Patient Instructions (Signed)
You have rotator cuff impingement Try to avoid painful activities (overhead activities, lifting with extended arm) as much as possible. Aleve 2 tabs twice a day with food OR ibuprofen 3 tabs three times a day with food for 7 days then as needed for pain and inflammation. Can take tylenol in addition to this. Subacromial injection may be beneficial to help with pain and to decrease inflammation. Consider physical therapy with transition to home exercise program. Do home exercise program with theraband and scapular stabilization exercises daily - these are very important for long term relief even if an injection was given. 3 sets of 10 each exercise for next 6 weeks. If not improving at follow-up we will consider imaging, injection, physical therapy, and/or nitro patches. Follow up with me in 6 weeks.

## 2015-01-11 DIAGNOSIS — M25512 Pain in left shoulder: Secondary | ICD-10-CM | POA: Insufficient documentation

## 2015-01-11 NOTE — Progress Notes (Signed)
PCP: Nance Pear., NP  Subjective:   HPI: Patient is a 39 y.o. female here for left shoulder pain.  Patient reports she started to get deep left shoulder pain about 1 month ago. Pain level 1/10 at rest, up to 3/10 with overhead motions, reaching. Hard to pull down, reach backwards. Worse lying on left shoulder. Not tried any medications or physical therapy. No known injury or trauma. Pain is dull. No skin changes, fever, other complaints.  Past Medical History  Diagnosis Date  . H/O pyelonephritis 2001 & 2009    No current outpatient prescriptions on file prior to visit.   No current facility-administered medications on file prior to visit.    Past Surgical History  Procedure Laterality Date  . Appendectomy  age 27    Allergies  Allergen Reactions  . Tdap [Diphth-Acell Pertussis-Tetanus] Swelling    "Lot of swelling and scar tissue at injection site"    Social History   Social History  . Marital Status: Married    Spouse Name: N/A  . Number of Children: 2  . Years of Education: N/A   Occupational History  . 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     Social History Main Topics  . Smoking status: Never Smoker   . Smokeless tobacco: Not on file  . Alcohol Use: 1.2 oz/week    2 Glasses of wine per week  . Drug Use: No  . Sexual Activity: Yes   Other Topics Concern  . 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     Family History  Problem Relation Age of Onset  . Asthma Neg Hx   . Alcohol abuse Neg Hx   . Drug abuse Neg Hx   . Hyperlipidemia Mother   . Hypertension Father   . Thyroid disease Father     hypethyroidism  . Hyperlipidemia Maternal Grandmother   . Cancer Maternal Grandmother     breast  . Hyperlipidemia  Maternal Grandfather   . Hypertension Maternal Grandfather   . Heart attack Maternal Grandfather   . Cancer Maternal Grandfather     prostate  . Heart disease Maternal Grandfather   . Stroke Maternal Grandfather   . Diabetes Paternal Grandmother   . Arthritis Paternal Grandmother   . Heart attack Paternal Grandfather     BP 134/79 mmHg  Pulse 69  Ht 5\' 6"  (1.676 m)  Review of Systems: See HPI above.    Objective:  Physical Exam:  Gen: NAD, comfortable in room.  Left shoulder: No swelling, ecchymoses.  No gross deformity. No TTP. FROM with painful arc. Positive Hawkins, negative Neers. Negative Speeds, Yergasons. Strength 5/5 with empty can and resisted internal/external rotation.  Mild pain empty can. Negative apprehension. NV intact distally.  Right shoulder: FROM without pain.    Assessment & Plan:  1. Left shoulder pain - 2/2 rotator cuff impingement.  Shown home exercises to do daily.  Icing, nsaids if needed.  Avoid overhead activities, reaching.  Consider PT, imaging, injection, nitro patches if not improving.  F/u in 6 weeks.

## 2015-01-11 NOTE — Assessment & Plan Note (Signed)
2/2 rotator cuff impingement.  Shown home exercises to do daily.  Icing, nsaids if needed.  Avoid overhead activities, reaching.  Consider PT, imaging, injection, nitro patches if not improving.  F/u in 6 weeks.

## 2015-03-05 ENCOUNTER — Ambulatory Visit (INDEPENDENT_AMBULATORY_CARE_PROVIDER_SITE_OTHER): Payer: PRIVATE HEALTH INSURANCE | Admitting: Family Medicine

## 2015-03-05 ENCOUNTER — Ambulatory Visit (HOSPITAL_BASED_OUTPATIENT_CLINIC_OR_DEPARTMENT_OTHER)
Admission: RE | Admit: 2015-03-05 | Discharge: 2015-03-05 | Disposition: A | Discharge status: Home / Self Care | Payer: PRIVATE HEALTH INSURANCE | Source: Ambulatory Visit | Attending: Family Medicine | Admitting: Family Medicine

## 2015-03-05 ENCOUNTER — Encounter: Payer: Self-pay | Admitting: Family Medicine

## 2015-03-05 VITALS — BP 156/86 | HR 63 | Ht 66.0 in

## 2015-03-05 DIAGNOSIS — S82001A Unspecified fracture of right patella, initial encounter for closed fracture: Secondary | ICD-10-CM | POA: Insufficient documentation

## 2015-03-05 DIAGNOSIS — W010XXA Fall on same level from slipping, tripping and stumbling without subsequent striking against object, initial encounter: Secondary | ICD-10-CM | POA: Diagnosis not present

## 2015-03-05 DIAGNOSIS — S8991XA Unspecified injury of right lower leg, initial encounter: Secondary | ICD-10-CM

## 2015-03-05 DIAGNOSIS — M25561 Pain in right knee: Secondary | ICD-10-CM

## 2015-03-05 MED ORDER — IBUPROFEN 800 MG PO TABS: 800.0000 mg | ORAL_TABLET | Freq: Three times a day (TID) | ORAL | Status: DC | PRN

## 2015-03-05 NOTE — Patient Instructions (Signed)
You have a small patella fracture. Wear the immobilizer at all times except to wash the area, ice but keep your knee straight when you use this. Ibuprofen 800mg  three times a day with food. Let me know if you need something stronger for pain. Icing 15 minutes at a time 3-4 times a day. Follow up with me in just over 1 week to repeat your x-rays. Expect 6-8 weeks for this to fully heal.

## 2015-03-06 DIAGNOSIS — S8991XA Unspecified injury of right lower leg, initial encounter: Secondary | ICD-10-CM | POA: Insufficient documentation

## 2015-03-06 MED ORDER — HYDROCODONE-ACETAMINOPHEN 5-325 MG PO TABS: 1.0000 | ORAL_TABLET | Freq: Four times a day (QID) | ORAL | Status: DC | PRN

## 2015-03-06 NOTE — Progress Notes (Signed)
PCP: Nance Pear., NP  Subjective:   HPI: Patient is a 39 y.o. female here for right knee injury.  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 Immediate pain, swelling. Unable to bear weight as well. Pain level over 7/10, sharp and throbbing. Took 600mg  ibuprofen. No prior right knee injuries.  Past Medical History  Diagnosis Date  . H/O pyelonephritis 2001 & 2009    No current outpatient prescriptions on file prior to visit.   No current facility-administered medications on file prior to visit.    Past Surgical History  Procedure Laterality Date  . Appendectomy  age 19    Allergies  Allergen Reactions  . Tdap [Diphth-Acell Pertussis-Tetanus] Swelling    "Lot of swelling and scar tissue at injection site"    Social History   Social History  . Marital Status: Married    Spouse Name: N/A  . Number of Children: 2  . Years of Education: N/A   Occupational History  . 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     Social History Main Topics  . Smoking status: Never Smoker   . Smokeless tobacco: Not on file  . Alcohol Use: 1.2 oz/week    2 Glasses of wine per week  . Drug Use: No  . Sexual Activity: Yes   Other Topics Concern  . 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     Family History  Problem Relation Age of Onset  . Asthma Neg Hx   . Alcohol abuse Neg Hx   . Drug abuse Neg Hx   . Hyperlipidemia Mother   . Hypertension Father   . Thyroid disease Father     hypethyroidism  . Hyperlipidemia Maternal Grandmother   . Cancer Maternal Grandmother     breast  . Hyperlipidemia Maternal Grandfather   . Hypertension Maternal Grandfather   . Heart attack  Maternal Grandfather   . Cancer Maternal Grandfather     prostate  . Heart disease Maternal Grandfather   . Stroke Maternal Grandfather   . Diabetes Paternal Grandmother   . Arthritis Paternal Grandmother   . Heart attack Paternal Grandfather     BP 156/86 mmHg  Pulse 63  Ht 5\' 6"  (1.676 m)  Review of Systems: See HPI above.    Objective:  Physical Exam:  Gen: NAD, comfortable in exam room.  Right knee: Mod effusion.  No bruising, other deformity. TTP anterior patella, less patellar tendon, suprapatellar area.  No joint line or other tenderness. ROM 0 - 90 degrees. Negative ant/post drawers. Negative valgus/varus testing. Negative lachmanns. Negative mcmurrays, apleys, patellar apprehension. NV intact distally.  Left knee: FROM without pain, effusion.    MSK u/s:  Patellar and quad tendons intact.  Effusion confirmed.  Assessment & Plan:  1. Right knee injury - radiographs show a nondisplaced patellar fracture - caused by fall at work on wet floor.  Stressed importance of keeping knee in extension until callus formation.  Immobilizer for this.  Ibuprofen, icing.  F/u in 1 week to repeat radiographs.  Expect 6-8 weeks to heal.

## 2015-03-06 NOTE — Assessment & Plan Note (Signed)
radiographs show a nondisplaced patellar fracture - caused by fall at work on wet floor.  Stressed importance of keeping knee in extension until callus formation.  Immobilizer for this.  Ibuprofen, icing.  F/u in 1 week to repeat radiographs.  Expect 6-8 weeks to heal.

## 2015-03-14 ENCOUNTER — Encounter: Payer: Self-pay | Admitting: Family Medicine

## 2015-03-14 ENCOUNTER — Ambulatory Visit (INDEPENDENT_AMBULATORY_CARE_PROVIDER_SITE_OTHER): Payer: PRIVATE HEALTH INSURANCE | Admitting: Family Medicine

## 2015-03-14 ENCOUNTER — Ambulatory Visit (HOSPITAL_BASED_OUTPATIENT_CLINIC_OR_DEPARTMENT_OTHER)
Admission: RE | Admit: 2015-03-14 | Discharge: 2015-03-14 | Disposition: A | Discharge status: Home / Self Care | Payer: PRIVATE HEALTH INSURANCE | Source: Ambulatory Visit | Attending: Family Medicine | Admitting: Family Medicine

## 2015-03-14 VITALS — BP 132/76 | HR 75 | Ht 66.0 in

## 2015-03-14 DIAGNOSIS — S82001D Unspecified fracture of right patella, subsequent encounter for closed fracture with routine healing: Secondary | ICD-10-CM

## 2015-03-14 DIAGNOSIS — S82009D Unspecified fracture of unspecified patella, subsequent encounter for closed fracture with routine healing: Secondary | ICD-10-CM | POA: Diagnosis not present

## 2015-03-14 DIAGNOSIS — R600 Localized edema: Secondary | ICD-10-CM | POA: Insufficient documentation

## 2015-03-14 DIAGNOSIS — S8991XD Unspecified injury of right lower leg, subsequent encounter: Secondary | ICD-10-CM

## 2015-03-14 DIAGNOSIS — X58XXXD Exposure to other specified factors, subsequent encounter: Secondary | ICD-10-CM | POA: Insufficient documentation

## 2015-03-15 NOTE — Progress Notes (Signed)
PCP: Nance Pear., NP  Subjective:   HPI: Patient is a 39 y.o. female here for right knee injury.  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 Immediate pain, swelling. Unable to bear weight as well. Pain level over 7/10, sharp and throbbing. Took 600mg  ibuprofen. No prior right knee injuries.  12/28: Patient reports her pain has improved - currently 0/10 level but gets sore with a lot of activity. Severe over 7/10 level of pain yesterday. Had pain medial right knee. No skin changes, fever, other complaints.  Past Medical History  Diagnosis Date  . H/O pyelonephritis 2001 & 2009    Current Outpatient Prescriptions on File Prior to Visit  Medication Sig Dispense Refill  . HYDROcodone-acetaminophen (NORCO) 5-325 MG tablet Take 1 tablet by mouth every 6 (six) hours as needed for moderate pain. 40 tablet 0  . ibuprofen (ADVIL,MOTRIN) 800 MG tablet Take 1 tablet (800 mg total) by mouth every 8 (eight) hours as needed. 90 tablet 1   No current facility-administered medications on file prior to visit.    Past Surgical History  Procedure Laterality Date  . Appendectomy  age 33    Allergies  Allergen Reactions  . Tdap [Diphth-Acell Pertussis-Tetanus] Swelling    "Lot of swelling and scar tissue at injection site"    Social History   Social History  . Marital Status: Married    Spouse Name: N/A  . Number of Children: 2  . Years of Education: N/A   Occupational History  . 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     Social History Main Topics  . Smoking status: Never Smoker   . Smokeless tobacco: Not on file  . Alcohol Use: 1.2 oz/week    2 Glasses of wine per week  . Drug Use: No  . Sexual Activity: Yes   Other Topics Concern  . 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     Family History  Problem Relation Age of Onset  . Asthma Neg Hx   . Alcohol abuse Neg Hx   . Drug abuse Neg Hx   . Hyperlipidemia Mother   . Hypertension Father   . Thyroid disease Father     hypethyroidism  . Hyperlipidemia Maternal Grandmother   . Cancer Maternal Grandmother     breast  . Hyperlipidemia Maternal Grandfather   . Hypertension Maternal Grandfather   . Heart attack Maternal Grandfather   . Cancer Maternal Grandfather     prostate  . Heart disease Maternal Grandfather   . Stroke Maternal Grandfather   . Diabetes Paternal Grandmother   . Arthritis Paternal Grandmother   . Heart attack Paternal Grandfather     BP 132/76 mmHg  Pulse 75  Ht 5\' 6"  (1.676 m)  LMP 02/26/2015  Review of Systems: See HPI above.    Objective:  Physical Exam:  Gen: NAD, comfortable in exam room.  Right knee: Mod effusion.  No bruising, other deformity. TTP anterior patella, less patellar tendon, suprapatellar area. ROM not tested today. Negative valgus/varus testing. NV intact distally.  Left knee: FROM without pain, effusion.  Assessment & Plan:  1. Right knee injury - independently  reviewed radiographs - no evidence displacement but no early healing yet.  Continue with immobilizer and knee in extension.  Icing, ibuprofen.  F/u in 2 weeks to repeat radiographs.

## 2015-03-15 NOTE — Assessment & Plan Note (Signed)
independently reviewed radiographs - no evidence displacement but no early healing yet.  Continue with immobilizer and knee in extension.  Icing, ibuprofen.  F/u in 2 weeks to repeat radiographs.

## 2015-03-28 ENCOUNTER — Encounter: Payer: Self-pay | Admitting: Family Medicine

## 2015-04-20 NOTE — Telephone Encounter (Signed)
°  Hi Kristie! Since I am a [employer] employee, the flu vaccine was mandatory and I received it in September 2016. It was administered by Irish Lack, RN in the ED at [facility]. Thank you and have a great weekend! Kim   Please update chart

## 2015-04-20 NOTE — Telephone Encounter (Signed)
Health Maintenance has been updated. 

## 2015-04-24 ENCOUNTER — Ambulatory Visit: Payer: PRIVATE HEALTH INSURANCE | Attending: Orthopedic Surgery | Admitting: Physical Therapy

## 2015-04-24 DIAGNOSIS — R262 Difficulty in walking, not elsewhere classified: Secondary | ICD-10-CM | POA: Diagnosis present

## 2015-04-24 DIAGNOSIS — M25561 Pain in right knee: Secondary | ICD-10-CM | POA: Insufficient documentation

## 2015-04-24 DIAGNOSIS — R29898 Other symptoms and signs involving the musculoskeletal system: Secondary | ICD-10-CM | POA: Insufficient documentation

## 2015-04-24 DIAGNOSIS — M25661 Stiffness of right knee, not elsewhere classified: Secondary | ICD-10-CM | POA: Diagnosis present

## 2015-04-24 DIAGNOSIS — R269 Unspecified abnormalities of gait and mobility: Secondary | ICD-10-CM | POA: Diagnosis present

## 2015-04-24 NOTE — Therapy (Signed)
[facility] [address]  [place], [place], [address] Phone: [phone]   Fax:  [phone]  Physical Therapy Evaluation  Patient Details  Name: [name] MRN: [id number] Date of Birth: [birth year] Referring Provider: Kathalene Frames. Mayer Camel, MD  Encounter Date: 04/24/2015      PT End of Session - 04/24/15 0901    Visit Number 1   Number of Visits 12   Date for PT Re-Evaluation 05/22/15   Authorization Type Worker's comp   Authorization - Visit Number 1   Authorization - Number of Visits 12   PT Start Time 0800   PT Stop Time 0850   PT Time Calculation (min) 50 min   Activity Tolerance Patient tolerated treatment well   Behavior During Therapy WFL for tasks assessed/performed      Past Medical History  Diagnosis Date  . H/O pyelonephritis 2001 & 2009    Past Surgical History  Procedure Laterality Date  . Appendectomy  age 57    There were no vitals filed for this visit.  Visit Diagnosis:  Right knee pain  Stiffness of right knee  Right leg weakness  Difficulty walking  Abnormality of gait      Subjective Assessment - 04/24/15 0804    Subjective Was at working on 03/04/16 walking back from consult in ED when slipped on wet floor going down into lunge position landing on right knee. X-ray taken by Dr. Barbaraann Barthel revealed a nondsiplaced patellar fracture. Placed in knee immbolizer x 6 weeks and just started going without ~1 week ago. Arrived to PT without AD, but reports still uses crutch for longer distances due to instability. Currently limited with lower body dressing and climbing stairs. Prior to injury had been training for a 10K run.   How long can you sit comfortably? no limit with sitting, but stiff upon rising    How long can you walk comfortably? must go slowly   Patient Stated Goals "Get back to training for running"   Currently in Pain? Yes   Pain Score 2   Least 0/10, Avg 2-3/10, Worst  5-7/10   Pain Location Knee   Pain Orientation Right;Anterior   Pain Descriptors / Indicators Aching   Pain Type Acute pain   Pain Onset More than a month ago   Pain Frequency Intermittent   Aggravating Factors  Positioning while sleeping, excessive flexion   Pain Relieving Factors Elevating, straightening leg, Ibuprofen   Effect of Pain on Daily Activities Slower pace with walking, difficulty with sit to stand transistions, difficulty with stairs, picking up 19 y/o son, getting in/out of car            [facility] PT Assessment - 04/24/15 0800    Assessment   Medical Diagnosis R patella fracture   Referring Provider Kathalene Frames. Mayer Camel, MD   Onset Date/Surgical Date 03/05/15   Next MD Visit 05/16/15   Prior Therapy none   Precautions   Precautions None   Balance Screen   Has the patient fallen in the past 6 months Yes   How many times? 1  at time of injury   Has the patient had a decrease in activity level because of a fear of falling?  No   Is the patient reluctant to leave their home because of a fear of falling?  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 [employer]   Observation/Other Assessments   Focus on Therapeutic Outcomes (FOTO)  Knee - 43% (57% limitation); Predicted 64% (36% limitation)   ROM / Strength   AROM / PROM / Strength AROM;PROM;Strength   AROM   AROM Assessment Site Knee   Right/Left Knee Right;Left   Right Knee Extension 14   Right Knee Flexion 87   Left Knee Extension -4   Left Knee Flexion 142   PROM   PROM Assessment Site Knee   Right/Left Knee Right   Right Knee Extension -4   Right Knee Flexion 94   Strength   Strength Assessment Site Hip;Knee;Ankle   Right/Left Hip Right;Left   Right Hip Flexion 3+/5   Right Hip Extension 4-/5   Right Hip ABduction 4-/5   Right Hip ADduction 3+/5   Left Hip Flexion 5/5   Left  Hip Extension 5/5   Left Hip ABduction 5/5   Left Hip ADduction 5/5   Right/Left Knee Right;Left   Right Knee Flexion 3+/5   Right Knee Extension 3+/5   Left Knee Flexion 5/5   Left Knee Extension 5/5   Right/Left Ankle Right;Left   Right Ankle Dorsiflexion 4/5   Right Ankle Plantar Flexion 4/5   Right Ankle Inversion 4-/5   Right Ankle Eversion 4-/5   Left Ankle Dorsiflexion 5/5   Left Ankle Plantar Flexion 5/5   Left Ankle Inversion 4+/5   Left Ankle Eversion 4+/5   Flexibility   Soft Tissue Assessment /Muscle Length yes   Hamstrings tight bilaterally    Quadriceps tight on right   ITB WNL   Palpation   Patella mobility Mildly restricted and tender with superior/inferior glides   Palpation comment generalized edema right anterior knee with ttp over inferior pole of patella   Ambulation/Gait   Gait Pattern Step-through pattern;Decreased stance time - right;Decreased hip/knee flexion - right;Decreased weight shift to right;Left circumduction;Antalgic  Decreased heel strike on R, decreased cadence         Today's Treatment  TherEx HEP instruction   Hamstring stretch with strap 2x30"   AAROM R Heel Slide with strap x10   R Quad set with towel roll behind knee 10x5"   R SAQ over 8" foam roll 10x3"   Hooklying B Hip Adduction with small ball 10x3"   Seated R Heel Slides with foot on small ball x10          PT Education - 04/24/15 0900    Education provided Yes   Education Details PT eval findings, POC, Initial HEP and use of ice/elevation for pain/edema management, Gait training to normalize gait pattern   Person(s) Educated Patient   Methods Explanation;Demonstration;Handout   Comprehension Verbalized understanding;Returned demonstration             PT Long Term Goals - 04/24/15 0919    PT LONG TERM GOAL #1   Title Independent with HEP/gym program by 05/22/15   Status New   PT LONG TERM GOAL #2   Title Pt will demonstrate right knee ROM 0-130 or  greater without pain by 05/22/15   Status New   PT LONG TERM GOAL #3   Title Pt will demonstrate right LE strength 4/5 or greater by 05/22/15   Status New   PT LONG TERM GOAL #4   Title Pt will ambulate with normal gait pattern without AD on all surfaces by 05/22/15   Status New   PT LONG  TERM GOAL #5   Title Pt will ascend/descend stairs reciprocally with normal step pattern by 05/22/15   Status New   Additional Long Term Goals   Additional Long Term Goals Yes   PT LONG TERM GOAL #6   Title Pt will report performance of normal household chores and job tasks without limitation due to right knee by 05/22/15   Status New               Plan - 04/24/15 0902    Clinical Impression Statement Patient is a 40 y/o female who presents to OP PT ~7 weeks s/p slip and fall in the workplace resulting in right nondisplaced patellar fracture. Patient initially in knee immobilizer x 6 weeks, released from knee immobilizer on 04/18/15 and referred to OP PT. Patient arrives to PT without AD demonstrating right antalgic gait with decreased hip/knee flexion and decreased heel strike. States continues to use single crutch when walking longer distances due to sensation of instability. Assessment reveals decreased ROM in right knee actively limited to 14-87 as compared to -4-142 on left, and passive right flexion limited to 94 degrees due to pain/edema with full extension PROM available at -4 dg. Right LE strength grossly impaired 3+/5 to 4-/5 at hip, 3+/5 at knee and 4-/5 to 4/5 at ankle. Significant peripatellar edema present in right anterior knee with mild edema also present in medial/lateral/posterior knee. Patient ttp over inferior pole of patella with limited tolerance for superior/inferior glide of patella due to pain. Patient will benefit from skilled PT to restore normal ROM and strength in right knee to allow resumption of normal daily activity and resume trianing for running.   Pt will benefit from skilled  therapeutic intervention in order to improve on the following deficits Pain;Decreased range of motion;Impaired flexibility;Increased edema;Decreased strength;Difficulty walking;Abnormal gait;Decreased activity tolerance   Rehab Potential Good   PT Frequency 3x / week   PT Duration 4 weeks   PT Treatment/Interventions Therapeutic exercise;Manual techniques;Passive range of motion;Taping;Therapeutic activities;Balance training;Neuromuscular re-education;Gait training;Stair training;Cryotherapy;Vasopneumatic Device;Electrical Stimulation;Iontophoresis 4mg /ml Dexamethasone   PT Next Visit Plan Review initial HEP, Gait training to normalize gait pattern, Right knee ROM, Right LE strengthening, Manual therapy for ROM and edema management, Modailities PRN   Consulted and Agree with Plan of Care Patient         Problem List Patient Active Problem List   Diagnosis Date Noted  . Right knee injury 03/06/2015  . Left shoulder pain 01/11/2015  . Preventative health care 11/13/2014  . Lower back injury 03/18/2012    Percival Spanish, PT, MPT 04/24/2015, 9:26 AM  [facility] [address]  [place], [place], [address] Phone: [phone]   Fax:  [phone]  Name: [name] MRN: [id number] Date of Birth: 14-Apr-1975

## 2015-04-26 ENCOUNTER — Ambulatory Visit: Payer: PRIVATE HEALTH INSURANCE | Admitting: Physical Therapy

## 2015-04-26 DIAGNOSIS — M25661 Stiffness of right knee, not elsewhere classified: Secondary | ICD-10-CM

## 2015-04-26 DIAGNOSIS — M25561 Pain in right knee: Secondary | ICD-10-CM

## 2015-04-26 DIAGNOSIS — R269 Unspecified abnormalities of gait and mobility: Secondary | ICD-10-CM

## 2015-04-26 DIAGNOSIS — R29898 Other symptoms and signs involving the musculoskeletal system: Secondary | ICD-10-CM

## 2015-04-26 DIAGNOSIS — R262 Difficulty in walking, not elsewhere classified: Secondary | ICD-10-CM

## 2015-04-26 NOTE — Therapy (Signed)
[facility] [address]  [place], [place], [address] Phone: [phone]   Fax:  [phone]  Physical Therapy Treatment  Patient Details  Name: [name] MRN: [id number] Date of Birth: 1975-03-19 Referring Provider: Kathalene Frames. Mayer Camel, MD  Encounter Date: 04/26/2015      PT End of Session - 04/26/15 0854    Visit Number 2   Number of Visits 12   Date for PT Re-Evaluation 05/22/15   Authorization Type Worker's comp   Authorization - Visit Number 2   Authorization - Number of Visits 12   PT Start Time 0849   PT Stop Time [phone]   PT Time Calculation (min) 49 min   Activity Tolerance Patient tolerated treatment well   Behavior During Therapy [facility] for tasks assessed/performed      Past Medical History  Diagnosis Date  . H/O pyelonephritis 2001 & 2009    Past Surgical History  Procedure Laterality Date  . Appendectomy  age 36    There were no vitals filed for this visit.  Visit Diagnosis:  Right knee pain  Stiffness of right knee  Right leg weakness  Difficulty walking  Abnormality of gait      Subjective Assessment - 04/26/15 0853    Subjective Patient reporting some some soreness after completing HEP especially with SAQ but not too bad and able to manage with Ibuprofen.   Currently in Pain? Yes   Pain Score 2    Pain Location Knee   Pain Orientation Right           Today's Treatment  TherEx NuStep - lvl 4 x 4' Hamstring stretch with strap 2x30" AAROM R Heel Slide with strap and towel under foot x10 R Quad set with thin folded pillow behind knee 10x5" R SAQ over 8" foam roll 10x3" Hooklying B Hip Adduction with small ball 10x3" Bridging 10x3" Hooklying Alternating Hip ABD/ER with blue TB 10x3" AAROM knee flexion/hamstring tuck with feet on peanut ball 12x3" R SLR 10x3" R SLR + ER 10x3" TKE with blue TB 10x3"   Vasopneumatic compression - R knee, medium compression, lowest  temp x10'           PT Long Term Goals - 04/26/15 0929    PT LONG TERM GOAL #1   Title Independent with HEP/gym program by 05/22/15   Status On-going   PT LONG TERM GOAL #2   Title Pt will demonstrate right knee ROM 0-130 or greater without pain by 05/22/15   Status On-going   PT LONG TERM GOAL #3   Title Pt will demonstrate right LE strength 4/5 or greater by 05/22/15   Status On-going   PT LONG TERM GOAL #4   Title Pt will ambulate with normal gait pattern without AD on all surfaces by 05/22/15   Status On-going   PT LONG TERM GOAL #5   Title Pt will ascend/descend stairs reciprocally with normal step pattern by 05/22/15   Status On-going   PT LONG TERM GOAL #6   Title Pt will report performance of normal household chores and job tasks without limitation due to right knee by 05/22/15   Status On-going               Plan - 04/26/15 0930    Clinical Impression Statement Reporting good compliance with HEP with some soreness, typically managed with Ibuprofen. Encouraged patient also to use ice for pain/edema, especially after exercising. Demonstrating good intial  improvement with flexibility and right knee ROM and tolerated exercise prgression today without complaints.   PT Next Visit Plan Gait training to normalize gait pattern, Right knee ROM, Right LE strengthening, Manual therapy for ROM and edema management, Modailities PRN   Consulted and Agree with Plan of Care Patient        Problem List Patient Active Problem List   Diagnosis Date Noted  . Right knee injury 03/06/2015  . Left shoulder pain 01/11/2015  . Preventative health care 11/13/2014  . Lower back injury 03/18/2012    Percival Spanish, PT, MPT 04/26/2015, 9:33 AM  [facility] [address]  [place], [place], [address] Phone: [phone]   Fax:  [phone]  Name: [name] MRN: [id number] Date of Birth: [birth year]

## 2015-04-27 ENCOUNTER — Ambulatory Visit: Payer: PRIVATE HEALTH INSURANCE | Admitting: Physical Therapy

## 2015-04-27 DIAGNOSIS — R29898 Other symptoms and signs involving the musculoskeletal system: Secondary | ICD-10-CM

## 2015-04-27 DIAGNOSIS — M25561 Pain in right knee: Secondary | ICD-10-CM | POA: Diagnosis not present

## 2015-04-27 DIAGNOSIS — R269 Unspecified abnormalities of gait and mobility: Secondary | ICD-10-CM

## 2015-04-27 DIAGNOSIS — M25661 Stiffness of right knee, not elsewhere classified: Secondary | ICD-10-CM

## 2015-04-27 DIAGNOSIS — R262 Difficulty in walking, not elsewhere classified: Secondary | ICD-10-CM

## 2015-04-27 NOTE — Therapy (Signed)
[facility] [address]  [name], [place], [address] Phone: [phone]   Fax:  [phone]  Physical Therapy Treatment  Patient Details  Name: [name] MRN: [id number] Date of Birth: 1975/08/16 Referring Provider: Kathalene Frames. Mayer Camel, MD  Encounter Date: 04/27/2015      PT End of Session - 04/27/15 0851    Visit Number 3   Number of Visits 12   Date for PT Re-Evaluation 05/22/15   Authorization Type Worker's comp   Authorization - Visit Number 3   Authorization - Number of Visits 12   PT Start Time [phone]   PT Stop Time [phone]   PT Time Calculation (min) 56 min   Activity Tolerance Patient tolerated treatment well   Behavior During Therapy [facility] for tasks assessed/performed      Past Medical History  Diagnosis Date  . H/O pyelonephritis 2001 & 2009    Past Surgical History  Procedure Laterality Date  . Appendectomy  age 40    There were no vitals filed for this visit.  Visit Diagnosis:  Right knee pain  Stiffness of right knee  Right leg weakness  Difficulty walking  Abnormality of gait      Subjective Assessment - 04/27/15 0849    Subjective Patient reporting some increased medial knee pain and quad soreness after therapy session yesterday but iced a few times last night and better this morning other than stiff.   Currently in Pain? Yes   Pain Score 2    Pain Location Knee   Pain Orientation Right;Anterior;Medial          Today's Treatment  TherEx NuStep - lvl 5 x 4' Manual R Hamstring & ITB stretches 2x30" Hooklying B Hip Adduction with small ball 10x3" AAROM knee flexion/hamstring tuck with feet on orange (55cm) Pball 15x3" Bridge with feet on orange (55cm) Pball 10x3" Hooklying Alternating Hip ABD/ER with blue TB 15x3" Bridge + B Hip ABD isometric Blue TB 10x3" R SLR 10x3" R SLR Hip ABD in L sidelying 10x3" R SLR Hip Ext in prone 10x3" R SLR Hip ADD in R sidelying 10x3" R  SLR + ER 10x3" TKE with blue TB 10x3" TRX squat x3 - Pt demonstrating lateral shift to L LE and when corrected c/o increased R medial knee pain, therefore deferred         PT Education - 04/27/15 1257    Education provided Yes   Education Details HEP addition   Person(s) Educated Patient   Methods Explanation;Demonstration;Handout   Comprehension Verbalized understanding;Returned demonstration             PT Long Term Goals - 04/26/15 0929    PT LONG TERM GOAL #1   Title Independent with HEP/gym program by 05/22/15   Status On-going   PT LONG TERM GOAL #2   Title Pt will demonstrate right knee ROM 0-130 or greater without pain by 05/22/15   Status On-going   PT LONG TERM GOAL #3   Title Pt will demonstrate right LE strength 4/5 or greater by 05/22/15   Status On-going   PT LONG TERM GOAL #4   Title Pt will ambulate with normal gait pattern without AD on all surfaces by 05/22/15   Status On-going   PT LONG TERM GOAL #5   Title Pt will ascend/descend stairs reciprocally with normal step pattern by 05/22/15   Status On-going   PT LONG TERM GOAL #6   Title Pt will  report performance of normal household chores and job tasks without limitation due to right knee by 05/22/15   Status On-going               Plan - 04/27/15 1258    Clinical Impression Statement Reporting increased medial R knee pain and quad soreness after exercise progression at last visit but managed with ice and ibuprofen. Progressed proximal strengthening with HEP updated to include 4-way SLR along with TKE with theraband. Limited tolerance for CKC knee flexion during attemtpted squats therefore activity deferred.   PT Next Visit Plan Gait training to normalize gait pattern, Right knee ROM, Right LE strengthening, Manual therapy for ROM and edema management, Modailities PRN   Consulted and Agree with Plan of Care Patient        Problem List Patient Active Problem List   Diagnosis Date Noted  . Right knee  injury 03/06/2015  . Left shoulder pain 01/11/2015  . Preventative health care 11/13/2014  . Lower back injury 03/18/2012    Percival Spanish, PT, MPT 04/27/2015, 1:06 PM  [facility] [address]  [place], [place], [address] Phone: [phone]   Fax:  [phone]  Name: [name] MRN: [id number] Date of Birth: [birth year]

## 2015-04-30 ENCOUNTER — Ambulatory Visit: Payer: PRIVATE HEALTH INSURANCE

## 2015-04-30 DIAGNOSIS — R29898 Other symptoms and signs involving the musculoskeletal system: Secondary | ICD-10-CM

## 2015-04-30 DIAGNOSIS — R262 Difficulty in walking, not elsewhere classified: Secondary | ICD-10-CM

## 2015-04-30 DIAGNOSIS — M25561 Pain in right knee: Secondary | ICD-10-CM | POA: Diagnosis not present

## 2015-04-30 DIAGNOSIS — R269 Unspecified abnormalities of gait and mobility: Secondary | ICD-10-CM

## 2015-04-30 DIAGNOSIS — M25661 Stiffness of right knee, not elsewhere classified: Secondary | ICD-10-CM

## 2015-04-30 NOTE — Therapy (Signed)
[facility] [address]  [place], [place], [address] Phone: [phone]   Fax:  [phone]  Physical Therapy Treatment  Patient Details  Name: [name] MRN: [id number] Date of Birth: 01-22-1976 Referring Provider: Kathalene Frames. Mayer Camel, MD  Encounter Date: 04/30/2015      PT End of Session - 04/30/15 0930    Visit Number 4   Number of Visits 12   Date for PT Re-Evaluation 05/22/15   Authorization - Visit Number 4   Authorization - Number of Visits 12   PT Start Time 0930   PT Stop Time 1016   PT Time Calculation (min) 46 min   Activity Tolerance Patient tolerated treatment well   Behavior During Therapy [facility] for tasks assessed/performed      Past Medical History  Diagnosis Date  . H/O pyelonephritis 2001 & 2009    Past Surgical History  Procedure Laterality Date  . Appendectomy  age 10    There were no vitals filed for this visit.  Visit Diagnosis:  Right knee pain  Stiffness of right knee  Right leg weakness  Difficulty walking  Abnormality of gait      Subjective Assessment - 04/30/15 0934    Subjective Pt. reports uncomfortable feeling and clicking on R knee while doing the HEP Sunday morning however reports R knee feeling better Sunday night.  Pt. reports pain 2/10 R knee at rest.     Patient Stated Goals "Get back to training for running"   Currently in Pain? Yes   Pain Score 2    Pain Location Knee   Pain Orientation Right;Anterior;Medial   Pain Descriptors / Indicators Aching   Pain Type Acute pain   Pain Radiating Towards n/a   Pain Onset More than a month ago   Effect of Pain on Daily Activities Ibuprofen    Multiple Pain Sites No      Today's Treatment  TherEx NuStep - lvl 5 x 4' Manual R Hamstring & ITB stretches x 30" Hooklying B Hip Adduction with small ball with bridging 10x3" AAROM knee flexion/hamstring tuck with feet on orange (55cm) Pball 15x3" Bridge with  feet on orange (55cm) Pball 15x3" Hooklying Alternating Hip ABD/ER with blue TB 15x3" R SLR 10x3" R SLR Hip ABD in L sidelying 10x3" R SLR Hip Ext in prone 10x3" R SLR Hip ADD in R sidelying 10x3" Fitter B hip extension x 10 reps  Monster walk with red TB x 30 ft Side stepping with red TB x 30 ft TKE with black TB 2 x 10x3" Standing R ITB stretch x 30 sec  Standing R calf stretch x 30 sec           PT Long Term Goals - 04/26/15 [id number]    PT LONG TERM GOAL #1   Title Independent with HEP/gym program by 05/22/15   Status On-going   PT LONG TERM GOAL #2   Title Pt will demonstrate right knee ROM 0-130 or greater without pain by 05/22/15   Status On-going   PT LONG TERM GOAL #3   Title Pt will demonstrate right LE strength 4/5 or greater by 05/22/15   Status On-going   PT LONG TERM GOAL #4   Title Pt will ambulate with normal gait pattern without AD on all surfaces by 05/22/15   Status On-going   PT LONG TERM GOAL #5   Title Pt will ascend/descend stairs reciprocally with normal step pattern by  05/22/15   Status On-going   PT LONG TERM GOAL #6   Title Pt will report performance of normal household chores and job tasks without limitation due to right knee by 05/22/15   Status On-going               Plan - 04/30/15 1024    Clinical Impression Statement Pt. tolerated therex for R LE strengthening well with no increase in R knee pain however continues to report slight clicking at R medial knee with flexion.  Pt. able to progress from blue to black TB on standing terminal knee extension in todays treatment. Pt. continues to be motivated and reports compliance with HEP.     PT Next Visit Plan Gait training to normalize gait pattern, Right knee ROM, Right LE strengthening, Manual therapy for ROM and edema management, Modailities PRN   Consulted and Agree with Plan of Care Patient        Problem List Patient Active Problem List   Diagnosis Date Noted  . Right knee injury 03/06/2015   . Left shoulder pain 01/11/2015  . Preventative health care 11/13/2014  . Lower back injury 03/18/2012    [name], PTA 04/30/2015, 10:42 AM  [facility] [address]  [place], [place], [address] Phone: [phone]   Fax:  [phone]  Name: [name] MRN: [id number] Date of Birth: 1975/04/13

## 2015-05-02 ENCOUNTER — Ambulatory Visit: Payer: PRIVATE HEALTH INSURANCE

## 2015-05-02 DIAGNOSIS — M25561 Pain in right knee: Secondary | ICD-10-CM

## 2015-05-02 DIAGNOSIS — R262 Difficulty in walking, not elsewhere classified: Secondary | ICD-10-CM

## 2015-05-02 DIAGNOSIS — R269 Unspecified abnormalities of gait and mobility: Secondary | ICD-10-CM

## 2015-05-02 DIAGNOSIS — M25661 Stiffness of right knee, not elsewhere classified: Secondary | ICD-10-CM

## 2015-05-02 DIAGNOSIS — R29898 Other symptoms and signs involving the musculoskeletal system: Secondary | ICD-10-CM

## 2015-05-02 NOTE — Therapy (Signed)
[facility] [address]  [place], [place], [address] Phone: [phone]   Fax:  [phone]  Physical Therapy Treatment  Patient Details  Name: [name] MRN: [id number] Date of Birth: Oct 03, 1975 Referring Provider: Kathalene Frames. Mayer Camel, MD  Encounter Date: 05/02/2015      PT End of Session - 05/02/15 1106    Visit Number 5   Number of Visits 12   Date for PT Re-Evaluation 05/22/15   Authorization - Visit Number 5   Authorization - Number of Visits 12   PT Start Time [id number]   PT Stop Time 0950   PT Time Calculation (min) 63 min   Activity Tolerance Patient tolerated treatment well   Behavior During Therapy [facility] for tasks assessed/performed      Past Medical History  Diagnosis Date  . H/O pyelonephritis 2001 & 2009    Past Surgical History  Procedure Laterality Date  . Appendectomy  age 49    There were no vitals filed for this visit.  Visit Diagnosis:  Right knee pain  Right leg weakness  Stiffness of right knee  Abnormality of gait  Difficulty walking      Subjective Assessment - 05/02/15 0852    Subjective Pt. reports pain at the R knee increased today at rest initially as a 5/10.  Pt. reports she felt pain at the R medial knee while sitting at a desk doing ROM exrercise stating, "I tensed up my quad and everything just tightened up.".   Patient Stated Goals "Get back to training for running"   Currently in Pain? Yes   Pain Score 5    Pain Location Knee   Pain Orientation Right;Anterior;Medial   Pain Descriptors / Indicators Aching   Pain Type Acute pain   Pain Radiating Towards n/a   Pain Onset More than a month ago   Pain Frequency Intermittent   Pain Relieving Factors elevating, straigtening leg, ibuprofen   Multiple Pain Sites No        Today's Treatment TherEx NuStep - lvl 5 x 4' Manual R Hamstring & ITB stretches x 30" Hooklying B Hip Adduction with small ball with  bridging 10x3" AAROM knee flexion/hamstring tuck with feet on orange (55cm) Pball 15x3" Bridge + hamstring curl combo with feet on orange (55cm) Pball 15x3" Bridge with isemotric + external rotation 3 x 5 each side L Side-lying clam shells x 10 reps (3 sec hold) Fitter B hip extension x 15 reps  Monster walk with green TB x 5'; performed 5' only; exercise suspended due to increase in R knee pain.   L side stepping with green TB 2 x 20 ft; side stepping leading with R knee avoided due to R knee pain  Vasopneumatic compression - R knee, medium compression, lowest temp x15'        PT Long Term Goals - 04/26/15 [id number]    PT LONG TERM GOAL #1   Title Independent with HEP/gym program by 05/22/15   Status On-going   PT LONG TERM GOAL #2   Title Pt will demonstrate right knee ROM 0-130 or greater without pain by 05/22/15   Status On-going   PT LONG TERM GOAL #3   Title Pt will demonstrate right LE strength 4/5 or greater by 05/22/15   Status On-going   PT LONG TERM GOAL #4   Title Pt will ambulate with normal gait pattern without AD on all surfaces by 05/22/15   Status On-going  PT LONG TERM GOAL #5   Title Pt will ascend/descend stairs reciprocally with normal step pattern by 05/22/15   Status On-going   PT LONG TERM GOAL #6   Title Pt will report performance of normal household chores and job tasks without limitation due to right knee by 05/22/15   Status On-going          Plan - 05/02/15 1110    Clinical Impression Statement Therex in today's visit focused on B hip / knee strengthening progressed per pt. tolerance.  Pt. with increased initial R medial knee pain and inflammation due to irritating R knee outside of therapy while performing a ROM exercise.  Pt. requested ice at end of treatment for pain relief and reported a decrease in pain following ice.  Despite pain pt. still able to tolerate increased strengthening repetitions in therapy.     PT Next Visit Plan Gait training to normalize  gait pattern, Right knee ROM, Right LE strengthening, Manual therapy for ROM and edema management, Modailities PRN   Consulted and Agree with Plan of Care Patient        Problem List Patient Active Problem List   Diagnosis Date Noted  . Right knee injury 03/06/2015  . Left shoulder pain 01/11/2015  . Preventative health care 11/13/2014  . Lower back injury 03/18/2012    [name], PTA 05/02/2015, 11:20 AM  [facility] [address]  [place], [place], [address] Phone: [phone]   Fax:  [phone]  Name: [name] MRN: [id number] Date of Birth: Mar 27, 1975

## 2015-05-04 ENCOUNTER — Ambulatory Visit: Payer: PRIVATE HEALTH INSURANCE | Admitting: Physical Therapy

## 2015-05-04 DIAGNOSIS — M25661 Stiffness of right knee, not elsewhere classified: Secondary | ICD-10-CM

## 2015-05-04 DIAGNOSIS — R269 Unspecified abnormalities of gait and mobility: Secondary | ICD-10-CM

## 2015-05-04 DIAGNOSIS — M25561 Pain in right knee: Secondary | ICD-10-CM | POA: Diagnosis not present

## 2015-05-04 DIAGNOSIS — R29898 Other symptoms and signs involving the musculoskeletal system: Secondary | ICD-10-CM

## 2015-05-04 DIAGNOSIS — R262 Difficulty in walking, not elsewhere classified: Secondary | ICD-10-CM

## 2015-05-04 NOTE — Therapy (Signed)
[facility] [address]  [place], [place], [address] Phone: [phone]   Fax:  [phone]  Physical Therapy Treatment  Patient Details  Name: [name] MRN: [id number] Date of Birth: 02/22/1976 Referring Provider: Kathalene Frames. Mayer Camel, MD  Encounter Date: 05/04/2015      PT End of Session - 05/04/15 0803    Visit Number 6   Number of Visits 12   Date for PT Re-Evaluation 05/22/15   Authorization Type Worker's comp   Authorization - Visit Number 6   Authorization - Number of Visits 12   PT Start Time 0800   PT Stop Time 0859   PT Time Calculation (min) 59 min   Activity Tolerance Patient tolerated treatment well   Behavior During Therapy [facility] for tasks assessed/performed      Past Medical History  Diagnosis Date  . H/O pyelonephritis 2001 & 2009    Past Surgical History  Procedure Laterality Date  . Appendectomy  age 65    There were no vitals filed for this visit.  Visit Diagnosis:  Right knee pain  Right leg weakness  Stiffness of right knee  Abnormality of gait  Difficulty walking      Subjective Assessment - 05/04/15 0802    Subjective Pt reports pain and swelling she was experiencing at last visit is less today but still having to take ibuprofen regularly.   Currently in Pain? Yes   Pain Score 2    Pain Location Knee   Pain Orientation Right;Anterior;Medial            [facility] PT Assessment - 05/04/15 0800    ROM / Strength   AROM / PROM / Strength AROM   AROM   AROM Assessment Site Knee   Right/Left Knee Right   Right Knee Extension 4   Right Knee Flexion 122         Today's Treatment  TherEx NuStep - lvl 5 x 4'  Manual  R Hamstring & ITB stretches in supine 2x30" R ITB stretch in L side-lying  Kinesiotape R patellar tendon - 2 strips (30%) medial and lateral to patella crossing at tibial tuberosity, 1 strip (50%) over inferior pole of patella  TherEx Bridge  with B Hip ADD ball squeeze 2x10, 3" hold Sustainded Bridge + Alternating Unilateral Hip ABD/ER with Black TB 3x5 L Side-lying R clam shells 2x10x3"  BATCA Leg Press 15# x10, 20# x10 BATCA B Knee Flexion 15# 2x10 Fitter (2 Blue) B hip extension x 15 reps   Vasopneumatic compression - R knee, medium compression, lowest temp x15'          PT Long Term Goals - 05/04/15 0845    PT LONG TERM GOAL #1   Title Independent with HEP/gym program by 05/22/15   Status On-going   PT LONG TERM GOAL #2   Title Pt will demonstrate right knee ROM 0-130 or greater without pain by 05/22/15   Status On-going   PT LONG TERM GOAL #3   Title Pt will demonstrate right LE strength 4/5 or greater by 05/22/15   Status On-going   PT LONG TERM GOAL #4   Title Pt will ambulate with normal gait pattern without AD on all surfaces by 05/22/15   Status On-going   PT LONG TERM GOAL #5   Title Pt will ascend/descend stairs reciprocally with normal step pattern by 05/22/15   Status On-going  Plan - 05/04/15 0835    Clinical Impression Statement Patient reporting improving pain and swelling since last visit but still expereiencing inferior patella and medial R knee pain with continued swelling therefore applied kinesiotape for patellar tendon with patient noting initial benefit during therapy session. If irritation persist may also consider taping for medial knee pain or ionto patch to help reduce pain/inflammation. Overall patient demonstrating excellent progress with R knee ROM, currently 4-122 as compared with 14-87 on eval.   PT Next Visit Plan Assess repsonse to taping, Right knee ROM, Right LE strengthening, Manual therapy for ROM and edema management, Modailities PRN, Taping if benefit noted   Consulted and Agree with Plan of Care Patient        Problem List Patient Active Problem List   Diagnosis Date Noted  . Right knee injury 03/06/2015  . Left shoulder pain 01/11/2015  . Preventative  health care 11/13/2014  . Lower back injury 03/18/2012    Percival Spanish, PT, MPT  05/04/2015, 8:46 AM  [facility] [address]  [place], [place], [address] Phone: [phone]   Fax:  [phone]  Name: [name] MRN: [id number] Date of Birth: 05-29-1975

## 2015-05-07 ENCOUNTER — Ambulatory Visit: Payer: PRIVATE HEALTH INSURANCE

## 2015-05-07 DIAGNOSIS — R269 Unspecified abnormalities of gait and mobility: Secondary | ICD-10-CM

## 2015-05-07 DIAGNOSIS — R29898 Other symptoms and signs involving the musculoskeletal system: Secondary | ICD-10-CM

## 2015-05-07 DIAGNOSIS — R262 Difficulty in walking, not elsewhere classified: Secondary | ICD-10-CM

## 2015-05-07 DIAGNOSIS — M25661 Stiffness of right knee, not elsewhere classified: Secondary | ICD-10-CM

## 2015-05-07 DIAGNOSIS — M25561 Pain in right knee: Secondary | ICD-10-CM | POA: Diagnosis not present

## 2015-05-07 NOTE — Therapy (Signed)
[facility] [address]  [place], [place], [address] Phone: [phone]   Fax:  [phone]  Physical Therapy Treatment  Patient Details  Name: [name] MRN: [id number] Date of Birth: 10-12-1975 Referring Provider: Kathalene Frames. Mayer Camel, MD  Encounter Date: 05/07/2015      PT End of Session - 05/07/15 0847    Visit Number 7   Number of Visits 12   Date for PT Re-Evaluation 05/22/15   Authorization Type Worker's comp   Authorization - Visit Number 7   Authorization - Number of Visits 12   PT Start Time [phone]   PT Stop Time 0940   PT Time Calculation (min) 53 min   Activity Tolerance Patient tolerated treatment well   Behavior During Therapy [facility] for tasks assessed/performed      Past Medical History  Diagnosis Date  . H/O pyelonephritis 2001 & 2009    Past Surgical History  Procedure Laterality Date  . Appendectomy  age 53    There were no vitals filed for this visit.  Visit Diagnosis:  Right knee pain  Right leg weakness  Stiffness of right knee  Abnormality of gait  Difficulty walking      Subjective Assessment - 05/07/15 0930    Subjective Pt. reports 1/10 pain at the R medial knee.     Patient Stated Goals "Get back to training for running"   Currently in Pain? Yes   Pain Score 1    Pain Location Knee   Pain Orientation Right;Anterior;Medial   Pain Descriptors / Indicators Aching   Pain Type Acute pain   Pain Radiating Towards n/a   Pain Onset More than a month ago   Aggravating Factors  excessive flexion   Pain Relieving Factors elevating, ice, straightening leg, ibuprofen   Multiple Pain Sites No      Oneita Hurt  Today's Treatment  TherEx NuStep - lvl 3 x 5'  Manual  R Hamstring & ITB stretches in supine 2x30" R ITB stretch in L side-lying x 30"  Kinesiotape R patellar tendon - 2 strips (30%) medial and lateral to patella crossing at tibial tuberosity, 1 strip  (50%) over inferior pole of patella  TherEx Bridge with B Hip ADD ball squeeze 2x10, 3" hold Sustainded Bridge + Alternating Unilateral Hip ABD/ER with Black TB 3x5  L Side-lying R clam shells with blue TB 2x10x3"   BATCA Leg Press 20# x10, 25# x10 BATCA B Knee Flexion 20# 2x10  Vasopneumatic compression - R knee, medium compression, lowest temp x10'        PT Long Term Goals - 05/04/15 0845    PT LONG TERM GOAL #1   Title Independent with HEP/gym program by 05/22/15   Status On-going   PT LONG TERM GOAL #2   Title Pt will demonstrate right knee ROM 0-130 or greater without pain by 05/22/15   Status On-going   PT LONG TERM GOAL #3   Title Pt will demonstrate right LE strength 4/5 or greater by 05/22/15   Status On-going   PT LONG TERM GOAL #4   Title Pt will ambulate with normal gait pattern without AD on all surfaces by 05/22/15   Status On-going   PT LONG TERM GOAL #5   Title Pt will ascend/descend stairs reciprocally with normal step pattern by 05/22/15   Status On-going            Plan - 05/07/15 1035  Clinical Impression Statement Pt. R medial knee pain much improved today at 1/10.  Pt. reported taping diminished pain over the weekend; tape reaplied in today's treatment.  Pt. tolerated increased weight with leg press and HS curl machine well today with no increase in R knee pain.     PT Next Visit Plan Assess response to taping, Right knee ROM, Right LE strengthening, Manual therapy for ROM and edema management, Modailities PRN, Taping if benefit noted   Consulted and Agree with Plan of Care Patient        Problem List Patient Active Problem List   Diagnosis Date Noted  . Right knee injury 03/06/2015  . Left shoulder pain 01/11/2015  . Preventative health care 11/13/2014  . Lower back injury 03/18/2012    [name], PTA 05/07/2015, 10:44 AM  [facility] [address]  [place], [place],  [address] Phone: [phone]   Fax:  [phone]  Name: [name] MRN: [id number] Date of Birth: 13-Oct-1975

## 2015-05-09 ENCOUNTER — Ambulatory Visit: Payer: PRIVATE HEALTH INSURANCE | Admitting: Physical Therapy

## 2015-05-09 DIAGNOSIS — R29898 Other symptoms and signs involving the musculoskeletal system: Secondary | ICD-10-CM

## 2015-05-09 DIAGNOSIS — M25561 Pain in right knee: Secondary | ICD-10-CM

## 2015-05-09 DIAGNOSIS — M25661 Stiffness of right knee, not elsewhere classified: Secondary | ICD-10-CM

## 2015-05-09 DIAGNOSIS — R269 Unspecified abnormalities of gait and mobility: Secondary | ICD-10-CM

## 2015-05-09 DIAGNOSIS — R262 Difficulty in walking, not elsewhere classified: Secondary | ICD-10-CM

## 2015-05-09 NOTE — Therapy (Signed)
[facility] [address]  [place], [place], [address] Phone: [phone]   Fax:  [phone]  Physical Therapy Treatment  Patient Details  Name: [name] MRN: [id number] Date of Birth: 1975-07-13 Referring Provider: Kathalene Frames. Mayer Camel, MD  Encounter Date: 05/09/2015      PT End of Session - 05/09/15 0805    Visit Number 8   Number of Visits 12   Date for PT Re-Evaluation 05/22/15   Authorization Type Worker's comp   Authorization - Visit Number 8   Authorization - Number of Visits 12   PT Start Time 0800   PT Stop Time 0859   PT Time Calculation (min) 59 min   Activity Tolerance Patient tolerated treatment well   Behavior During Therapy WFL for tasks assessed/performed      Past Medical History  Diagnosis Date  . H/O pyelonephritis 2001 & 2009    Past Surgical History  Procedure Laterality Date  . Appendectomy  age 58    There were no vitals filed for this visit.  Visit Diagnosis:  Right knee pain  Right leg weakness  Stiffness of right knee  Abnormality of gait  Difficulty walking      Subjective Assessment - 05/09/15 0803    Subjective Patient reports she had lots of walking yesterday around [facility] campus including up/down hills and noted increased pain last night up to 3/10. Iced last night and better this morning, but still feels more swollen.   Currently in Pain? Yes   Pain Score 1    Pain Location Knee   Pain Orientation Right            [facility] PT Assessment - 05/09/15 0800    ROM / Strength   AROM / PROM / Strength AROM;Strength   AROM   AROM Assessment Site Knee   Right/Left Knee Right   Right Knee Extension 2   Right Knee Flexion 132   Strength   Strength Assessment Site Hip;Knee   Right/Left Hip Right   Right Hip Flexion 4/5   Right Hip Extension 4/5   Right Hip ABduction 4/5   Right Hip ADduction 4-/5   Right/Left Knee Right   Right Knee Flexion 4/5   Right Knee  Extension 4-/5         Today's Treatment  TherEx Rec Bike - lvl 2 x 5'  Manual  R Hamstring & ITB stretches in supine 2x30" R ITB & Hip flexor stretch in L side-lying 2x30" R Quad stretch in prone 2x30'  TherEx Bridge with B Hip ADD ball squeeze 2x10, 3" hold (attempted alternating knee extension while in bridge but popping sensation with some discomfort noted, therefore deferred) Sustainded Bridge + Alternating Unilateral Hip ABD/ER with Black TB 4x5 L Side-lying R clam shells with Black TB 10x3"  Seated Hamstring curl with Blue TB x10 BATCA Leg Press 25# 2x10 TKE with Black TB 2x10 TRX squat x15, initial cues to decreased depth of squat to avoid weight shift to L   Vasopneumatic compression - R knee, medium compression, lowest temp x10'          PT Education - 05/09/15 0277    Education provided Yes   Education Details HEP update - see Pt Instructions for new exercises, prior exercises D/C'd except stretches and TKE (progressed to black TB)   Person(s) Educated Patient   Methods Explanation;Demonstration;Handout   Comprehension Verbalized understanding;Returned demonstration  PT Long Term Goals - 05/09/15 1255    PT LONG TERM GOAL #1   Title Independent with HEP/gym program by 05/22/15   Status On-going   PT LONG TERM GOAL #2   Status Partially Met  2-132: Met for flexion ROM   PT LONG TERM GOAL #3   Title Pt will demonstrate right LE strength 4/5 or greater by 05/22/15   Status Partially Met   PT LONG TERM GOAL #4   Title Pt will ambulate with normal gait pattern without AD on all surfaces by 05/22/15   Status Achieved   PT LONG TERM GOAL #5   Title Pt will ascend/descend stairs reciprocally with normal step pattern by 05/22/15   Status On-going               Plan - 05/09/15 1247    Clinical Impression Statement Patient continues to demonstrate excellent progress, with continued improvement in R knee ROM with extension only lacking 2  degrees actively (full PROM) and within 10 degrees of L for flexion actively. Strength in R hip and knee improved by at least 1/2 grade for all MMT. HEP updated with several of initial exercises discontinued except stretches and theraband TKE, and some of current therapy exercises transitioned to HEP to allow progression to more standing CKC exercises during therapy sessions.   PT Next Visit Plan Right knee ROM, Right LE strengthening with progression of standing CKC exercises, Manual therapy including taping PRN for ROM and edema management, Modailities PRN   Consulted and Agree with Plan of Care Patient        Problem List Patient Active Problem List   Diagnosis Date Noted  . Right knee injury 03/06/2015  . Left shoulder pain 01/11/2015  . Preventative health care 11/13/2014  . Lower back injury 03/18/2012    Percival Spanish, PT, MPT 05/09/2015, 12:57 PM  [facility] [address]  [place], [place], [address] Phone: [phone]   Fax:  [phone]  Name: [name] MRN: [id number] Date of Birth: 1975-05-17

## 2015-05-11 ENCOUNTER — Ambulatory Visit: Payer: PRIVATE HEALTH INSURANCE

## 2015-05-11 DIAGNOSIS — M25561 Pain in right knee: Secondary | ICD-10-CM

## 2015-05-11 DIAGNOSIS — M25661 Stiffness of right knee, not elsewhere classified: Secondary | ICD-10-CM

## 2015-05-11 DIAGNOSIS — R29898 Other symptoms and signs involving the musculoskeletal system: Secondary | ICD-10-CM

## 2015-05-11 DIAGNOSIS — R269 Unspecified abnormalities of gait and mobility: Secondary | ICD-10-CM

## 2015-05-11 NOTE — Therapy (Signed)
[facility] [address]  [place], [place], [address] Phone: [phone]   Fax:  [phone]  Physical Therapy Treatment  Patient Details  Name: [name] MRN: [id number] Date of Birth: 08-21-1975 Referring Provider: Kathalene Frames. Mayer Camel, MD  Encounter Date: 05/11/2015      PT End of Session - 05/11/15 1056    Visit Number 9   Number of Visits 12   Date for PT Re-Evaluation 05/22/15   Authorization Type Worker's comp   Authorization - Visit Number 9   Authorization - Number of Visits 12   PT Start Time 1059   PT Stop Time 5027   PT Time Calculation (min) 58 min   Activity Tolerance Patient tolerated treatment well   Behavior During Therapy WFL for tasks assessed/performed      Past Medical History  Diagnosis Date  . H/O pyelonephritis 2001 & 2009    Past Surgical History  Procedure Laterality Date  . Appendectomy  age 24    There were no vitals filed for this visit.  Visit Diagnosis:  Right knee pain  Right leg weakness  Stiffness of right knee  Abnormality of gait      Subjective Assessment - 05/11/15 1102    Subjective Pt. reports R knee pain 4/10 pain at rest.  Pt. states she thinks squats last treatment session caused a flare up of R knee pain.    Patient Stated Goals "Get back to training for running"   Currently in Pain? Yes   Pain Score 4    Pain Location Knee   Pain Orientation Right   Pain Descriptors / Indicators Aching   Pain Type Acute pain   Pain Radiating Towards n/a   Pain Onset More than a month ago   Pain Frequency Intermittent   Aggravating Factors  stairs   Pain Relieving Factors ice at night, meds,    Effect of Pain on Daily Activities Ibuprofen   Multiple Pain Sites No     Today's Treatment  TherEx NuStep: 4', 2 resistance   Manual  R Hamstring & ITB stretches in supine 2x30" R ITB & Hip flexor stretch in L side-lying 2x30" R Quad stretch in prone 2 x  30"  TherEx Fitter hip bilateral extension, abduction x 10 each leg (2 blue bands) Seated knee ext. fitter 2 x 10 reps each  Bridge with B Hip ADD ball squeeze 2x10, 3" hold Sustainded Bridge + Alternating Unilateral Hip ABD/ER  3 x 5 each   BATCA Leg curl x 10 BATCA leg ext. 25# x 10 reps  TKE with Black TB 2x10   Vasopneumatic compression - R knee, Supine, medium compression, lowest temp x10'         PT Long Term Goals - 05/09/15 1255    PT LONG TERM GOAL #1   Title Independent with HEP/gym program by 05/22/15   Status On-going   PT LONG TERM GOAL #2   Status Partially Met  2-132: Met for flexion ROM   PT LONG TERM GOAL #3   Title Pt will demonstrate right LE strength 4/5 or greater by 05/22/15   Status Partially Met   PT LONG TERM GOAL #4   Title Pt will ambulate with normal gait pattern without AD on all surfaces by 05/22/15   Status Achieved   PT LONG TERM GOAL #5   Title Pt will ascend/descend stairs reciprocally with normal step pattern by 05/22/15   Status On-going  Plan - 05/11/15 1057    Clinical Impression Statement Pt. with increased pain today R Lateral / medial patella reporting squats irritated the knee last visit.  Today's visit avoided squatting and LE stepping activities focused on B standing and sitting hip and knee strengtheneing per pt. pain tolerance.  Pt. reported R knee pain decreased following ice / compression ending visit.     PT Next Visit Plan Right knee ROM, Right LE strengthening with progression of standing CKC exercises, Manual therapy including taping PRN for ROM and edema management, Modailities PRN   Consulted and Agree with Plan of Care Patient       Problem List Patient Active Problem List   Diagnosis Date Noted  . Right knee injury 03/06/2015  . Left shoulder pain 01/11/2015  . Preventative health care 11/13/2014  . Lower back injury 03/18/2012    [name], PTA 05/11/2015, 12:32 PM  [facility] [address]  [place], [place], [address] Phone: [phone]   Fax:  [phone]  Name: [name] MRN: [id number] Date of Birth: [birth year]

## 2015-05-14 ENCOUNTER — Ambulatory Visit: Payer: PRIVATE HEALTH INSURANCE | Admitting: Physical Therapy

## 2015-05-14 DIAGNOSIS — M25661 Stiffness of right knee, not elsewhere classified: Secondary | ICD-10-CM

## 2015-05-14 DIAGNOSIS — R262 Difficulty in walking, not elsewhere classified: Secondary | ICD-10-CM

## 2015-05-14 DIAGNOSIS — M25561 Pain in right knee: Secondary | ICD-10-CM

## 2015-05-14 DIAGNOSIS — R29898 Other symptoms and signs involving the musculoskeletal system: Secondary | ICD-10-CM

## 2015-05-14 DIAGNOSIS — R269 Unspecified abnormalities of gait and mobility: Secondary | ICD-10-CM

## 2015-05-14 NOTE — Therapy (Signed)
[facility] [address]  [place], [place], [address] Phone: [phone]   Fax:  [phone]  Physical Therapy Treatment  Patient Details  Name: [name] MRN: [id number] Date of Birth: 05-08-1975 Referring Provider: Kathalene Frames. Mayer Camel, MD  Encounter Date: 05/14/2015      PT End of Session - 05/14/15 1619    Visit Number 10   Number of Visits 20   Date for PT Re-Evaluation 06/18/15   Authorization Type Worker's comp   Authorization - Visit Number 10   Authorization - Number of Visits 12   PT Start Time 8937   PT Stop Time 1700   PT Time Calculation (min) 45 min   Activity Tolerance Patient tolerated treatment well   Behavior During Therapy WFL for tasks assessed/performed      Past Medical History  Diagnosis Date  . H/O pyelonephritis 2001 & 2009    Past Surgical History  Procedure Laterality Date  . Appendectomy  age 40    There were no vitals filed for this visit.  Visit Diagnosis:  Right knee pain  Right leg weakness  Stiffness of right knee  Abnormality of gait  Difficulty walking      Subjective Assessment - 05/14/15 1617    Subjective Patient reports pain overall improving but still taking occasional ibuprofen and icing knee at night.   Currently in Pain? 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 [employer] (was up to 7 miles in training for 10 mile run prior to injury)   ROM / Strength   AROM / PROM / Strength AROM;Strength   AROM   AROM Assessment Site Knee    Right/Left Knee Right   Right Knee Extension 0   Right Knee Flexion 134   Strength   Strength Assessment Site Hip;Knee   Right/Left Hip Right;Left   Right Hip Flexion 4+/5  pain with effort of SLR but did not increase with resistance   Right Hip Extension 4/5   Right Hip ABduction 4+/5   Right Hip ADduction 4/5   Right/Left Knee Right   Right Knee Flexion 4+/5   Right Knee Extension 4/5  pain with resistance         Today's Treatment  ROM/MMT check Goal Assessment  TherEx Rec Bike - lvl 2 x 4'  TherEx BATCA Knee Flexion 25# 2x10, B concentric/R eccentric BATCA Knee Extension - deferred due to increased pain even at low weights BATCA Leg Press 25# 2x10 reps  R Fitter hip extension & abduction x 10 each leg (1 Black/1 Blue) Sidestepping in partial squat with green TB x 56f, Bil Monster walk with green TB 2x23f        PT Long Term Goals - 05/14/15 1627    PT LONG TERM GOAL #1   Title Independent with HEP/gym program by 06/18/15   Status Partially Met  Pt independent with current HEP but has not yet  initiated gym program   PT LONG TERM GOAL #2   Title Pt will demonstrate right knee ROM 0-130 or greater without pain by 05/22/15   Status Achieved   PT LONG TERM GOAL #3   Title Pt will demonstrate right LE strength 4/5 or greater by 05/22/15   Status Achieved  Pain still present with resisted knee extension   PT LONG TERM GOAL #4   Title Pt will ambulate with normal gait pattern without AD on all surfaces by 05/22/15   Status Achieved   PT LONG TERM GOAL #5   Title Pt will ascend/descend stairs reciprocally with normal step pattern by 06/18/15   Status On-going  Patient able to ascend recpirocally but relies more on hip extension to create lift. Still more consistently descends with step-to pattern; when reciprocal pattern attempted, patient reports increased pain and sensation that knee may give way   Additional Long Term Goals   Additional Long Term Goals Yes   PT  LONG TERM GOAL #6   Title Pt will report performance of normal household chores and job tasks without limitation due to right knee by 06/18/15   Status On-going  If able to remain at desk, can typically tolerate a full work day w/o restriction, but if needs to be on feet for extended period, pain continues to limit work tolerance. Pain continues to limit tolerance with household chores when standing or squatting   PT LONG TERM GOAL #7   Title Pt will resume running at least 1 mile without increased R knee pain by 06/18/15   Status New   PT LONG TERM GOAL #8   Title Pt will demonstrate right LE strength 5/5 without pain by 06/18/15   Status New               Plan - 05/14/15 1717    Clinical Impression Statement Patient has overall demonstrated excellent progress with PT so far. R knee ROM has improved from 14-87 to [facility] at 0-134. R hip strength has improved to 4+/5 in flexion and abduction, 4/5 in extension and adduction. R knee strength has improved to 4+/5 in hamstrings and 4/5 in quads, but still notes pain with resisted extension. Pain typically remains at ~2/10 but has flared as high as 4-5/10 in past 2 weeks. Clicking sensation frequently noted with R knee flexion activites, but not typically painful. Patient has demonstrated functional improvement with normalization of gait pattern but remains limited with reciprocal stair ascent and descent due to pain and sensation of potential for knee to give way. Patient has not yet been able to resume running due to persistent pain and continued instability, but would like to be able to do so before completing PT as she had been running up to 7 miles at a time prior to the injury and would like to resume training for a 10 mile race that she had planned to participate in prior to the injury. Goals met for R knee ROM and gait with remaining goals partially met or ongoing. Recommend continued PT for additional 8 visits at 2x/wk in addition to remaining visits  in existing POC to continue focus on dynamic functional strengthening to restore PLOF and prepare patient to resume running program.   Pt will benefit from skilled therapeutic intervention in order to improve on the following deficits Pain;Impaired flexibility;Increased edema;Decreased strength;Difficulty walking;Abnormal gait;Decreased activity tolerance   PT Frequency 2x / week  starting week of 05/21/15   PT Duration 4 weeks  PT Treatment/Interventions Therapeutic exercise;Manual techniques;Passive range of motion;Taping;Therapeutic activities;Balance training;Neuromuscular re-education;Gait training;Stair training;Cryotherapy;Vasopneumatic Device;Electrical Stimulation;Iontophoresis 44m/ml Dexamethasone;Patient/family education   PT Next Visit Plan Right knee ROM, Right LE strengthening with progression of standing CKC exercises, Manual therapy including taping PRN for ROM and edema management, Modailities PRN   Consulted and Agree with Plan of Care Patient        Problem List Patient Active Problem List   Diagnosis Date Noted  . Right knee injury 03/06/2015  . Left shoulder pain 01/11/2015  . Preventative health care 11/13/2014  . Lower back injury 03/18/2012    JPercival Spanish PT, MPT 05/14/2015, 6:30 PM  [facility] [place] [address]Phone: [phone]  Fax:  [phone] Name: Karen ZYLKAMRN: [id number] of Birth: 808-Mar-1977

## 2015-05-18 ENCOUNTER — Other Ambulatory Visit (HOSPITAL_BASED_OUTPATIENT_CLINIC_OR_DEPARTMENT_OTHER): Payer: Self-pay | Admitting: Orthopedic Surgery

## 2015-05-18 DIAGNOSIS — M25561 Pain in right knee: Secondary | ICD-10-CM

## 2015-05-19 ENCOUNTER — Ambulatory Visit (HOSPITAL_BASED_OUTPATIENT_CLINIC_OR_DEPARTMENT_OTHER)
Admission: RE | Admit: 2015-05-19 | Discharge: 2015-05-19 | Disposition: A | Discharge status: Home / Self Care | Payer: PRIVATE HEALTH INSURANCE | Source: Ambulatory Visit | Attending: Orthopedic Surgery | Admitting: Orthopedic Surgery

## 2015-05-19 DIAGNOSIS — W19XXXS Unspecified fall, sequela: Secondary | ICD-10-CM | POA: Diagnosis not present

## 2015-05-19 DIAGNOSIS — M7651 Patellar tendinitis, right knee: Secondary | ICD-10-CM | POA: Insufficient documentation

## 2015-05-19 DIAGNOSIS — M7121 Synovial cyst of popliteal space [Baker], right knee: Secondary | ICD-10-CM | POA: Diagnosis not present

## 2015-05-19 DIAGNOSIS — M25561 Pain in right knee: Secondary | ICD-10-CM

## 2015-05-19 DIAGNOSIS — S82009S Unspecified fracture of unspecified patella, sequela: Secondary | ICD-10-CM | POA: Diagnosis not present

## 2015-05-19 DIAGNOSIS — S83241S Other tear of medial meniscus, current injury, right knee, sequela: Secondary | ICD-10-CM | POA: Insufficient documentation

## 2015-05-19 DIAGNOSIS — M7051 Other bursitis of knee, right knee: Secondary | ICD-10-CM | POA: Insufficient documentation

## 2015-05-19 DIAGNOSIS — M25461 Effusion, right knee: Secondary | ICD-10-CM | POA: Insufficient documentation

## 2015-05-22 ENCOUNTER — Ambulatory Visit: Payer: PRIVATE HEALTH INSURANCE

## 2015-05-24 ENCOUNTER — Ambulatory Visit: Payer: PRIVATE HEALTH INSURANCE

## 2015-05-28 ENCOUNTER — Ambulatory Visit: Payer: PRIVATE HEALTH INSURANCE | Admitting: Physical Therapy

## 2015-05-31 ENCOUNTER — Ambulatory Visit: Payer: PRIVATE HEALTH INSURANCE | Admitting: Physical Therapy

## 2015-06-01 ENCOUNTER — Encounter (HOSPITAL_BASED_OUTPATIENT_CLINIC_OR_DEPARTMENT_OTHER): Payer: Self-pay | Admitting: *Deleted

## 2015-06-01 ENCOUNTER — Other Ambulatory Visit: Payer: Self-pay | Admitting: Orthopedic Surgery

## 2015-06-04 ENCOUNTER — Ambulatory Visit: Payer: PRIVATE HEALTH INSURANCE

## 2015-06-05 ENCOUNTER — Other Ambulatory Visit: Payer: Self-pay | Admitting: Family

## 2015-06-05 MED FILL — valACYclovir HCL 1 GM TABS: 1 | 30 days supply | Qty: 30 | Fill #0

## 2015-06-05 NOTE — Telephone Encounter (Signed)
Medication filled to pharmacy as requested.   

## 2015-06-07 ENCOUNTER — Ambulatory Visit: Payer: PRIVATE HEALTH INSURANCE

## 2015-06-07 NOTE — H&P (Signed)
[name] is an 40 y.o. female.     Chief Complaint: Right Knee Pain  HPI: [name] returns today with some catching and pain that continues in her right knee.  She did have a patella fracture on 03/05/2015 that radiographically has gone to heal.  Because of the pain and catching we did obtain an MRI scan that shows healing of the fracture, a relatively large posterior peripheral tear of the medial meniscus as well as a fracture cleft in the articular cartilage best seen on a few of the sagittal views.  Past Medical History  Diagnosis Date  . H/O pyelonephritis 2001 & 2009  . Acute medial meniscus tear of right knee     Past Surgical History  Procedure Laterality Date  . Appendectomy  age 24    Family History  Problem Relation Age of Onset  . Asthma Neg Hx   . Alcohol abuse Neg Hx   . Drug abuse Neg Hx   . Hyperlipidemia Mother   . Hypertension Father   . Thyroid disease Father     hypethyroidism  . Hyperlipidemia Maternal Grandmother   . Cancer Maternal Grandmother     breast  . Hyperlipidemia Maternal Grandfather   . Hypertension Maternal Grandfather   . Heart attack Maternal Grandfather   . Cancer Maternal Grandfather     prostate  . Heart disease Maternal Grandfather   . Stroke Maternal Grandfather   . Diabetes Paternal Grandmother   . Arthritis Paternal Grandmother   . Heart attack Paternal Grandfather    Social History:  reports that she has never smoked. She does not have any smokeless tobacco history on file. She reports that she drinks about 1.2 oz of alcohol per week. She reports that she does not use illicit drugs.  Allergies:  Allergies  Allergen Reactions  . Tdap [Diphth-Acell Pertussis-Tetanus] Swelling    "Lot of swelling and scar tissue at injection site"    No prescriptions prior to admission    No results found for this or any previous visit (from the past 48 hour(s)). No results found.  Review of Systems  Constitutional: Negative.   HENT:  Negative.   Eyes: Negative.   Respiratory: Negative.   Cardiovascular: Negative.   Gastrointestinal: Negative.   Genitourinary: Negative.   Musculoskeletal: Positive for joint pain.  Skin: Negative.   Neurological: Negative.   Endo/Heme/Allergies: Negative.   Psychiatric/Behavioral: Negative.     Height 5\' 6"  (1.676 m), weight 61.236 kg (135 lb), last menstrual period 05/07/2015. Physical Exam  Constitutional: She is oriented to person, place, and time. She appears well-developed and well-nourished.  HENT:  Head: Normocephalic and atraumatic.  Eyes: Pupils are equal, round, and reactive to light.  Neck: Normal range of motion. Neck supple.  Cardiovascular: Intact distal pulses.   Respiratory: Effort normal.  Musculoskeletal: She exhibits tenderness.  Tender along the inferior pole of patella one plus crepitus as you take her through range of motion and tender along the posterior medial joint line.  Neurological: She is alert and oriented to person, place, and time.  Skin: Skin is warm and dry.  Psychiatric: She has a normal mood and affect. Her behavior is normal. Judgment and thought content normal.     Assessment/Plan Assess: Status post-injury at work within the inferior pole patella fracture and a medial meniscal tear, peripheral seen on MRI scan.  Plan: It has been over 2 months since the injury, she has a cleft in the articular cartilage of the patella  and more importantly a meniscal tear that will not heal on its own.  Based on her symptoms and the MRI findings, an arthroscopy is warranted for debridement of any chondromalacia and repair or debridement of the posterior horn meniscal tear.  Based on the morphology seen on the MRI scan repair with the [employer] Conmed sequent system is probably warranted.    PHILLIPS, ERIC R, PA-C 06/07/2015, 1:38 PM

## 2015-06-08 ENCOUNTER — Ambulatory Visit (HOSPITAL_BASED_OUTPATIENT_CLINIC_OR_DEPARTMENT_OTHER): Payer: PRIVATE HEALTH INSURANCE | Admitting: Anesthesiology

## 2015-06-08 ENCOUNTER — Ambulatory Visit (HOSPITAL_BASED_OUTPATIENT_CLINIC_OR_DEPARTMENT_OTHER)
Admission: RE | Admit: 2015-06-08 | Discharge: 2015-06-08 | Disposition: A | Discharge status: Home / Self Care | Payer: PRIVATE HEALTH INSURANCE | Source: Ambulatory Visit | Attending: Orthopedic Surgery | Admitting: Orthopedic Surgery

## 2015-06-08 ENCOUNTER — Encounter (HOSPITAL_BASED_OUTPATIENT_CLINIC_OR_DEPARTMENT_OTHER): Payer: Self-pay | Admitting: Anesthesiology

## 2015-06-08 ENCOUNTER — Encounter (HOSPITAL_BASED_OUTPATIENT_CLINIC_OR_DEPARTMENT_OTHER)
Admission: RE | Disposition: A | Discharge status: Home / Self Care | Payer: Self-pay | Source: Ambulatory Visit | Attending: Orthopedic Surgery

## 2015-06-08 DIAGNOSIS — M2241 Chondromalacia patellae, right knee: Secondary | ICD-10-CM | POA: Insufficient documentation

## 2015-06-08 DIAGNOSIS — M2240 Chondromalacia patellae, unspecified knee: Secondary | ICD-10-CM | POA: Diagnosis present

## 2015-06-08 HISTORY — DX: Other tear of medial meniscus, current injury, right knee, initial encounter: S83.241A

## 2015-06-08 HISTORY — PX: KNEE ARTHROSCOPY: SHX127

## 2015-06-08 SURGERY — ARTHROSCOPY, KNEE
Anesthesia: General | Site: Knee | Laterality: Right

## 2015-06-08 MED ORDER — ONDANSETRON HCL 4 MG/2ML IJ SOLN: 4.0000 mg | Freq: Four times a day (QID) | INTRAMUSCULAR | Status: DC | PRN

## 2015-06-08 MED ORDER — METHYLPREDNISOLONE ACETATE 80 MG/ML IJ SUSP
INTRAMUSCULAR | Status: AC
  Filled 2015-06-08: qty 1

## 2015-06-08 MED ORDER — ONDANSETRON HCL 4 MG/2ML IJ SOLN
INTRAMUSCULAR | Status: AC
  Filled 2015-06-08: qty 2

## 2015-06-08 MED ORDER — SUCCINYLCHOLINE CHLORIDE 20 MG/ML IJ SOLN
INTRAMUSCULAR | Status: AC
  Filled 2015-06-08: qty 1

## 2015-06-08 MED ORDER — PROPOFOL 10 MG/ML IV BOLUS
INTRAVENOUS | Status: DC | PRN
  Administered 2015-06-08: 150 mg via INTRAVENOUS

## 2015-06-08 MED ORDER — OXYCODONE HCL 5 MG PO TABS
5.0000 mg | ORAL_TABLET | Freq: Once | ORAL | Status: AC | PRN
  Administered 2015-06-08: 5 mg via ORAL

## 2015-06-08 MED ORDER — CEFAZOLIN SODIUM-DEXTROSE 2-4 GM/100ML-% IV SOLN
INTRAVENOUS | Status: AC
  Filled 2015-06-08: qty 100

## 2015-06-08 MED ORDER — MIDAZOLAM HCL 2 MG/2ML IJ SOLN
INTRAMUSCULAR | Status: AC
  Filled 2015-06-08: qty 2

## 2015-06-08 MED ORDER — HYDROMORPHONE HCL 1 MG/ML IJ SOLN
0.2500 mg | INTRAMUSCULAR | Status: DC | PRN
  Administered 2015-06-08 (×2): 0.5 mg via INTRAVENOUS

## 2015-06-08 MED ORDER — DEXAMETHASONE SODIUM PHOSPHATE 10 MG/ML IJ SOLN
INTRAMUSCULAR | Status: AC
  Filled 2015-06-08: qty 1

## 2015-06-08 MED ORDER — METHYLPREDNISOLONE ACETATE 40 MG/ML IJ SUSP
INTRAMUSCULAR | Status: AC
  Filled 2015-06-08: qty 1

## 2015-06-08 MED ORDER — DEXAMETHASONE SODIUM PHOSPHATE 4 MG/ML IJ SOLN
INTRAMUSCULAR | Status: DC | PRN
  Administered 2015-06-08: 10 mg via INTRAVENOUS

## 2015-06-08 MED ORDER — SCOPOLAMINE 1 MG/3DAYS TD PT72: 1.0000 | MEDICATED_PATCH | Freq: Once | TRANSDERMAL | Status: DC | PRN

## 2015-06-08 MED ORDER — MIDAZOLAM HCL 2 MG/2ML IJ SOLN
1.0000 mg | INTRAMUSCULAR | Status: DC | PRN
  Administered 2015-06-08: 2 mg via INTRAVENOUS

## 2015-06-08 MED ORDER — LACTATED RINGERS IV SOLN
INTRAVENOUS | Status: DC
  Administered 2015-06-08: 10:00:00 via INTRAVENOUS

## 2015-06-08 MED ORDER — METOCLOPRAMIDE HCL 5 MG/ML IJ SOLN: 5.0000 mg | Freq: Three times a day (TID) | INTRAMUSCULAR | Status: DC | PRN

## 2015-06-08 MED ORDER — DEXTROSE-NACL 5-0.45 % IV SOLN: INTRAVENOUS | Status: DC

## 2015-06-08 MED ORDER — ONDANSETRON HCL 4 MG PO TABS: 4.0000 mg | ORAL_TABLET | Freq: Four times a day (QID) | ORAL | Status: DC | PRN

## 2015-06-08 MED ORDER — HYDROMORPHONE HCL 1 MG/ML IJ SOLN
INTRAMUSCULAR | Status: AC
  Filled 2015-06-08: qty 1

## 2015-06-08 MED ORDER — ONDANSETRON HCL 4 MG/2ML IJ SOLN
INTRAMUSCULAR | Status: DC | PRN
  Administered 2015-06-08: 4 mg via INTRAVENOUS

## 2015-06-08 MED ORDER — BUPIVACAINE-EPINEPHRINE (PF) 0.5% -1:200000 IJ SOLN
INTRAMUSCULAR | Status: AC
  Filled 2015-06-08: qty 60

## 2015-06-08 MED ORDER — FENTANYL CITRATE (PF) 100 MCG/2ML IJ SOLN
INTRAMUSCULAR | Status: AC
  Filled 2015-06-08: qty 2

## 2015-06-08 MED ORDER — GLYCOPYRROLATE 0.2 MG/ML IJ SOLN: 0.2000 mg | Freq: Once | INTRAMUSCULAR | Status: DC | PRN

## 2015-06-08 MED ORDER — BUPIVACAINE HCL (PF) 0.5 % IJ SOLN
INTRAMUSCULAR | Status: AC
  Filled 2015-06-08: qty 30

## 2015-06-08 MED ORDER — LIDOCAINE HCL (CARDIAC) 20 MG/ML IV SOLN
INTRAVENOUS | Status: AC
  Filled 2015-06-08: qty 5

## 2015-06-08 MED ORDER — SODIUM CHLORIDE 0.9 % IR SOLN
Status: DC | PRN
  Administered 2015-06-08: 6000 mL

## 2015-06-08 MED ORDER — LIDOCAINE HCL (CARDIAC) 20 MG/ML IV SOLN
INTRAVENOUS | Status: DC | PRN
  Administered 2015-06-08: 60 mg via INTRAVENOUS

## 2015-06-08 MED ORDER — EPINEPHRINE HCL 1 MG/ML IJ SOLN
INTRAMUSCULAR | Status: AC
  Filled 2015-06-08: qty 1

## 2015-06-08 MED ORDER — DEXTROSE 5 % IV SOLN
2.0000 g | INTRAVENOUS | Status: AC
  Administered 2015-06-08: 2 g via INTRAVENOUS

## 2015-06-08 MED ORDER — KETOROLAC TROMETHAMINE 30 MG/ML IJ SOLN: 30.0000 mg | Freq: Once | INTRAMUSCULAR | Status: DC | PRN

## 2015-06-08 MED ORDER — OXYCODONE HCL 5 MG/5ML PO SOLN: 5.0000 mg | Freq: Once | ORAL | Status: AC | PRN

## 2015-06-08 MED ORDER — OXYCODONE HCL 5 MG PO TABS
ORAL_TABLET | ORAL | Status: AC
  Filled 2015-06-08: qty 1

## 2015-06-08 MED ORDER — MEPERIDINE HCL 25 MG/ML IJ SOLN: 6.2500 mg | INTRAMUSCULAR | Status: DC | PRN

## 2015-06-08 MED ORDER — PROMETHAZINE HCL 25 MG/ML IJ SOLN: 6.2500 mg | INTRAMUSCULAR | Status: DC | PRN

## 2015-06-08 MED ORDER — METOCLOPRAMIDE HCL 5 MG PO TABS: 5.0000 mg | ORAL_TABLET | Freq: Three times a day (TID) | ORAL | Status: DC | PRN

## 2015-06-08 MED ORDER — HYDROCODONE-ACETAMINOPHEN 5-325 MG PO TABS: 1.0000 | ORAL_TABLET | Freq: Four times a day (QID) | ORAL | Status: DC | PRN

## 2015-06-08 MED ORDER — PROPOFOL 10 MG/ML IV BOLUS
INTRAVENOUS | Status: AC
  Filled 2015-06-08: qty 20

## 2015-06-08 MED ORDER — CHLORHEXIDINE GLUCONATE 4 % EX LIQD: 60.0000 mL | Freq: Once | CUTANEOUS | Status: DC

## 2015-06-08 MED ORDER — FENTANYL CITRATE (PF) 100 MCG/2ML IJ SOLN
50.0000 ug | INTRAMUSCULAR | Status: DC | PRN
  Administered 2015-06-08: 100 ug via INTRAVENOUS

## 2015-06-08 MED ORDER — BUPIVACAINE-EPINEPHRINE 0.5% -1:200000 IJ SOLN
INTRAMUSCULAR | Status: DC | PRN
  Administered 2015-06-08: 20 mL

## 2015-06-08 SURGICAL SUPPLY — 43 items
BANDAGE ACE 6X5 VEL STRL LF (GAUZE/BANDAGES/DRESSINGS) ×3 IMPLANT
BLADE 4.2CUDA (BLADE) IMPLANT
BLADE CUTTER GATOR 3.5 (BLADE) IMPLANT
BLADE GREAT WHITE 4.2 (BLADE) IMPLANT
BLADE GREAT WHITE 4.2MM (BLADE)
BNDG COHESIVE 6X5 TAN STRL LF (GAUZE/BANDAGES/DRESSINGS) ×3 IMPLANT
DRAPE ARTHROSCOPY W/POUCH 114 (DRAPES) ×3 IMPLANT
DURAPREP 26ML APPLICATOR (WOUND CARE) ×3 IMPLANT
ELECT MENISCUS 165MM 90D (ELECTRODE) IMPLANT
ELECT REM PT RETURN 9FT ADLT (ELECTROSURGICAL)
ELECTRODE REM PT RTRN 9FT ADLT (ELECTROSURGICAL) IMPLANT
GAUZE SPONGE 4X4 12PLY STRL (GAUZE/BANDAGES/DRESSINGS) ×3 IMPLANT
GAUZE XEROFORM 1X8 LF (GAUZE/BANDAGES/DRESSINGS) ×3 IMPLANT
GLOVE BIO SURGEON STRL SZ7.5 (GLOVE) ×3 IMPLANT
GLOVE BIO SURGEON STRL SZ8.5 (GLOVE) ×3 IMPLANT
GLOVE BIOGEL PI IND STRL 7.0 (GLOVE) ×2 IMPLANT
GLOVE BIOGEL PI IND STRL 8 (GLOVE) ×1 IMPLANT
GLOVE BIOGEL PI IND STRL 9 (GLOVE) ×1 IMPLANT
GLOVE BIOGEL PI INDICATOR 7.0 (GLOVE) ×4
GLOVE BIOGEL PI INDICATOR 8 (GLOVE) ×2
GLOVE BIOGEL PI INDICATOR 9 (GLOVE) ×2
GLOVE ECLIPSE 6.5 STRL STRAW (GLOVE) ×3 IMPLANT
GOWN STRL REUS W/ TWL LRG LVL3 (GOWN DISPOSABLE) ×1 IMPLANT
GOWN STRL REUS W/TWL LRG LVL3 (GOWN DISPOSABLE) ×2
GOWN STRL REUS W/TWL XL LVL3 (GOWN DISPOSABLE) ×3 IMPLANT
IV NS IRRIG 3000ML ARTHROMATIC (IV SOLUTION) IMPLANT
KNEE WRAP E Z 3 GEL PACK (MISCELLANEOUS) ×3 IMPLANT
MANIFOLD NEPTUNE II (INSTRUMENTS) IMPLANT
NDL SAFETY ECLIPSE 18X1.5 (NEEDLE) ×1 IMPLANT
NEEDLE HYPO 18GX1.5 SHARP (NEEDLE) ×2
NEEDLE MENISCAL REPAIR W/EYELT (NEEDLE) IMPLANT
PACK ARTHROSCOPY DSU (CUSTOM PROCEDURE TRAY) ×3 IMPLANT
PACK BASIN DAY SURGERY FS (CUSTOM PROCEDURE TRAY) ×3 IMPLANT
PAD ALCOHOL SWAB (MISCELLANEOUS) ×3 IMPLANT
PENCIL BUTTON HOLSTER BLD 10FT (ELECTRODE) IMPLANT
SET ARTHROSCOPY TUBING (MISCELLANEOUS) ×2
SET ARTHROSCOPY TUBING LN (MISCELLANEOUS) ×1 IMPLANT
SLEEVE SCD COMPRESS KNEE MED (MISCELLANEOUS) IMPLANT
SYR 3ML 18GX1 1/2 (SYRINGE) IMPLANT
SYR 5ML LL (SYRINGE) ×3 IMPLANT
TOWEL OR 17X24 6PK STRL BLUE (TOWEL DISPOSABLE) ×3 IMPLANT
WAND STAR VAC 90 (SURGICAL WAND) IMPLANT
WATER STERILE IRR 1000ML POUR (IV SOLUTION) ×3 IMPLANT

## 2015-06-08 NOTE — Interval H&P Note (Signed)
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 After consideration of risks, benefits and other options for treatment, the patient has consented to  Procedure(s): RIGHT KNEE ARTHROSCOPY WITH MEDIAL MENISCAL REPAIR (Right) as a surgical intervention .  The patient's history has been reviewed, patient examined, no change in status, stable for surgery.  I have reviewed the patient's chart and labs.  Questions were answered to the patient's satisfaction.     Kerin Salen

## 2015-06-08 NOTE — Anesthesia Procedure Notes (Signed)
Procedure Name: LMA Insertion Date/Time: 06/08/2015 10:08 AM Performed by: Lyndee Leo Pre-anesthesia Checklist: Patient identified, Emergency Drugs available, Suction available and Patient being monitored Patient Re-evaluated:Patient Re-evaluated prior to inductionOxygen Delivery Method: Circle System Utilized Preoxygenation: Pre-oxygenation with 100% oxygen Intubation Type: IV induction Ventilation: Mask ventilation without difficulty LMA: LMA inserted LMA Size: 4.0 Number of attempts: 1 Airway Equipment and Method: Bite block Placement Confirmation: positive ETCO2 Tube secured with: Tape Dental Injury: Teeth and Oropharynx as per pre-operative assessment

## 2015-06-08 NOTE — Discharge Instructions (Addendum)
Knee Arthroscopy, Care After °Refer to this sheet in the next few weeks. These instructions provide you with information about caring for yourself after your procedure. Your health care provider may also give you more specific instructions. Your treatment has been planned according to current medical practices, but problems sometimes occur. Call your health care provider if you have any problems or questions after your procedure. °WHAT TO EXPECT AFTER THE PROCEDURE °After your procedure, it is common to have: °· Soreness. °· Pain. °HOME CARE INSTRUCTIONS °Bathing °· Do not take baths, swim, or use a hot tub until your health care provider approves. °Incision Care °· There are many different ways to close and cover an incision, including stitches, skin glue, and adhesive strips. Follow your health care provider's instructions about: °¨ Incision care. °¨ Bandage (dressing) changes and removal. °¨ Incision closure removal. °· Check your incision area every day for signs of infection. Watch for: °¨ Redness, swelling, or pain. °¨ Fluid, blood, or pus. °Activity °· Avoid strenuous activities for as long as directed by your health care provider. °· Return to your normal activities as directed by your health care provider. Ask your health care provider what activities are safe for you. °· Perform range-of-motion exercises only as directed by your health care provider. °· Do not lift anything that is heavier than 10 lb (4.5 kg). °· Do not drive or operate heavy machinery while taking pain medicine. °· If you were given crutches, use them as directed by your health care provider. °Managing Pain, Stiffness, and Swelling °· If directed, apply ice to the injured area: °¨ Put ice in a plastic bag. °¨ Place a towel between your skin and the bag. °¨ Leave the ice on for 20 minutes, 2-3 times per day. °· Raise the injured area above the level of your heart while you are sitting or lying down as directed by your health care  provider. °General Instructions °· Keep all follow-up visits as directed by your health care provider. This is important. °· Take medicines only as directed by your health care provider. °· Do not use any tobacco products, including cigarettes, chewing tobacco, or electronic cigarettes. If you need help quitting, ask your health care provider. °· If you were given compression stockings, wear them as directed by your health care provider. These stockings help prevent blood clots and reduce swelling in your legs. °SEEK MEDICAL CARE IF: °· You have severe pain with any movement of your knee. °· You notice a bad smell coming from the incision or dressing. °· You have redness, swelling, or pain at the site of your incision. °· You have fluid, blood, or pus coming from your incision. °SEEK IMMEDIATE MEDICAL CARE IF: °· You develop a rash. °· You have a fever. °· You have difficulty breathing or have shortness of breath. °· You develop pain in your calves or in the back of your knee. °· You develop chest pain. °· You develop numbness or tingling in your leg or foot. °  °This information is not intended to replace advice given to you by your health care provider. Make sure you discuss any questions you have with your health care provider. °  °Document Released: 09/20/2004 Document Revised: 07/18/2014 Document Reviewed: 02/27/2014 °Elsevier Interactive Patient Education ©2016 [employer] ° ° °Post Anesthesia Home Care Instructions ° °Activity: °Get plenty of rest for the remainder of the day. A responsible adult should stay with you for 24 hours following the procedure.  °For the next   24 hours, DO NOT: °-Drive a car °-Operate machinery °-Drink alcoholic beverages °-Take any medication unless instructed by your physician °-Make any legal decisions or sign important papers. ° °Meals: °Start with liquid foods such as gelatin or soup. Progress to regular foods as tolerated. Avoid greasy, spicy, heavy foods. If nausea and/or  vomiting occur, drink only clear liquids until the nausea and/or vomiting subsides. Call your physician if vomiting continues. ° °Special Instructions/Symptoms: °Your throat may feel dry or sore from the anesthesia or the breathing tube placed in your throat during surgery. If this causes discomfort, gargle with warm salt water. The discomfort should disappear within 24 hours. ° °If you had a scopolamine patch placed behind your ear for the management of post- operative nausea and/or vomiting: ° °1. The medication in the patch is effective for 72 hours, after which it should be removed.  Wrap patch in a tissue and discard in the trash. Wash hands thoroughly with soap and water. °2. You may remove the patch earlier than 72 hours if you experience unpleasant side effects which may include dry mouth, dizziness or visual disturbances. °3. Avoid touching the patch. Wash your hands with soap and water after contact with the patch. °  ° °

## 2015-06-08 NOTE — Anesthesia Postprocedure Evaluation (Signed)
Anesthesia Post Note  Patient: [name]  Procedure(s) Performed: Procedure(s) (LRB): RIGHT KNEE ARTHROSCOPY WITH DEBRIDEMENT (Right)  Patient location during evaluation: PACU Anesthesia Type: General Level of consciousness: sedated and patient cooperative Pain management: pain level controlled Vital Signs Assessment: post-procedure vital signs reviewed and stable Respiratory status: spontaneous breathing Cardiovascular status: stable Anesthetic complications: no    Last Vitals:  Filed Vitals:   06/08/15 1115 06/08/15 1130  BP: 123/77 110/76  Pulse: 58 50  Temp:    Resp: 15 11    Last Pain:  Filed Vitals:   06/08/15 1146  PainSc: 2                  Nolon Nations

## 2015-06-08 NOTE — Transfer of Care (Signed)
Immediate Anesthesia Transfer of Care Note  Patient: [name]  Procedure(s) Performed: Procedure(s): RIGHT KNEE ARTHROSCOPY WITH DEBRIDEMENT (Right)  Patient Location: PACU  Anesthesia Type:General  Level of Consciousness: awake, sedated and patient cooperative  Airway & Oxygen Therapy: Patient Spontanous Breathing and Patient connected to face mask oxygen  Post-op Assessment: Report given to RN and Post -op Vital signs reviewed and stable  Post vital signs: Reviewed and stable  Last Vitals:  Filed Vitals:   06/08/15 0905  BP: 122/76  Pulse: 79  Temp: 36.6 C  Resp: 18    Complications: No apparent anesthesia complications

## 2015-06-08 NOTE — Anesthesia Preprocedure Evaluation (Signed)
Anesthesia Evaluation  Patient identified by MRN, date of birth, ID band Patient awake    Reviewed: Allergy & Precautions, NPO status , Patient's Chart, lab work & pertinent test results  Airway Mallampati: II  TM Distance: >3 FB Neck ROM: Full    Dental no notable dental hx.    Pulmonary neg pulmonary ROS,    Pulmonary exam normal breath sounds clear to auscultation       Cardiovascular negative cardio ROS Normal cardiovascular exam Rhythm:Regular Rate:Normal     Neuro/Psych negative neurological ROS  negative psych ROS   GI/Hepatic negative GI ROS, Neg liver ROS,   Endo/Other  negative endocrine ROS  Renal/GU negative Renal ROS     Musculoskeletal negative musculoskeletal ROS (+)   Abdominal   Peds  Hematology negative hematology ROS (+)   Anesthesia Other Findings   Reproductive/Obstetrics negative OB ROS                            Anesthesia Physical Anesthesia Plan  ASA: I  Anesthesia Plan: General   Post-op Pain Management:    Induction: Intravenous  Airway Management Planned: LMA  Additional Equipment:   Intra-op Plan:   Post-operative Plan: Extubation in OR  Informed Consent: I have reviewed the patients History and Physical, chart, labs and discussed the procedure including the risks, benefits and alternatives for the proposed anesthesia with the patient or authorized representative who has indicated his/her understanding and acceptance.   Dental advisory given  Plan Discussed with: CRNA  Anesthesia Plan Comments:         Anesthesia Quick Evaluation  

## 2015-06-08 NOTE — Op Note (Signed)
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 Mayer Camel M.D.  Assist: Kerry Hough. Barton Dubois  (present throughout entire procedure and necessary for timely completion of the procedure) Anes: General LMA  EBL: Minimal  Fluids: 800 cc   Indications: Catching popping and pain after patella fracture a few months ago MRI scan shows possible chondromalacia with flap tears at the fracture site which is healed also read out as showing peripheral rim medial meniscal tear she does have some posterior pain.. Pt has failed conservative treatment with anti-inflammatory medicines, physical therapy, and modified activites but did get good temporarily from an intra-articular cortisone injection. Pain has recurred and patient desires elective arthroscopic evaluation and treatment of knee. Risks and benefits of surgery have been discussed and questions answered.  Procedure: Patient identified by arm band and taken to the operating room at the day surgery Center. The appropriate anesthetic monitors were attached, and General LMA anesthesia was induced without difficulty. Lateral post was applied to the table and the lower extremity was prepped and draped in usual sterile fashion from the ankle to the midthigh. Time out procedure was performed. We began the operation by making standard inferior lateral and inferior medial peripatellar portals with a #11 blade allowing introduction of the arthroscope through the inferior lateral portal and the out flow to the inferior medial portal. Pump pressure was set at 100 mmHg and diagnostic arthroscopy  revealed grade 3 chondromalacia with flap tears at the junction of the distal and middle poles of the patella this is debrided back to a stable margin with a 3.5 mm Gator sucker shaver  alternating portals. The anterior cruciate ligament and PCL are intact. The medial meniscus was intact to probing 2 lateral compartment was in excellent condition articular cartilages were otherwise normal Lockman's test was negative.. The knee was irrigated out normal saline solution. A dressing of xerofoam 4 x 4 dressing sponges, web roll and an Ace wrap was applied. The patient was awakened extubated and taken to the recovery without difficulty.    Signed: Kerin Salen, MD

## 2015-06-11 ENCOUNTER — Ambulatory Visit: Payer: PRIVATE HEALTH INSURANCE | Admitting: Physical Therapy

## 2015-06-11 ENCOUNTER — Encounter (HOSPITAL_BASED_OUTPATIENT_CLINIC_OR_DEPARTMENT_OTHER): Payer: Self-pay | Admitting: Orthopedic Surgery

## 2015-06-15 ENCOUNTER — Ambulatory Visit: Payer: PRIVATE HEALTH INSURANCE

## 2015-06-19 MED FILL — IBUPROFEN 800 MG TABLET: 800 | 30 days supply | Qty: 90 | Fill #1

## 2015-06-20 ENCOUNTER — Ambulatory Visit: Payer: PRIVATE HEALTH INSURANCE | Attending: Orthopedic Surgery | Admitting: Physical Therapy

## 2015-06-20 DIAGNOSIS — M25661 Stiffness of right knee, not elsewhere classified: Secondary | ICD-10-CM | POA: Insufficient documentation

## 2015-06-20 DIAGNOSIS — R29898 Other symptoms and signs involving the musculoskeletal system: Secondary | ICD-10-CM | POA: Insufficient documentation

## 2015-06-20 DIAGNOSIS — M25561 Pain in right knee: Secondary | ICD-10-CM | POA: Insufficient documentation

## 2015-06-20 DIAGNOSIS — R262 Difficulty in walking, not elsewhere classified: Secondary | ICD-10-CM | POA: Diagnosis present

## 2015-06-20 DIAGNOSIS — R2689 Other abnormalities of gait and mobility: Secondary | ICD-10-CM | POA: Insufficient documentation

## 2015-06-20 DIAGNOSIS — M6281 Muscle weakness (generalized): Secondary | ICD-10-CM | POA: Insufficient documentation

## 2015-06-20 DIAGNOSIS — R269 Unspecified abnormalities of gait and mobility: Secondary | ICD-10-CM | POA: Insufficient documentation

## 2015-06-20 NOTE — Therapy (Addendum)
[facility] [address]  [place], [place], [address] Phone: [phone]   Fax:  [phone]  Physical Therapy Treatment  Patient Details  Name: [name] MRN: [id number] Date of Birth: [birth year] Referring Provider: Kathalene Frames. Mayer Camel, MD  Encounter Date: 06/20/2015      PT End of Session - 06/20/15 1115    Visit Number 11   Number of Visits 22   Date for PT Re-Evaluation 08/01/15   Authorization Type Worker's comp   Authorization - Visit Number 1   Authorization - Number of Visits 12   PT Start Time 1104   PT Stop Time 1143   PT Time Calculation (min) 39 min   Activity Tolerance Patient tolerated treatment well   Behavior During Therapy WFL for tasks assessed/performed      Past Medical History  Diagnosis Date  . H/O pyelonephritis 2001 & 2009  . Acute medial meniscus tear of right knee     Past Surgical History  Procedure Laterality Date  . Appendectomy  age 24  . Knee arthroscopy Right 06/08/2015    Procedure: RIGHT KNEE ARTHROSCOPY WITH DEBRIDEMENT;  Surgeon: Frederik Pear, MD;  Location: [place];  Service: Orthopedics;  Laterality: Right;    There were no vitals filed for this visit.  Visit Diagnosis:  Right knee pain  Stiffness of right knee  Difficulty walking  Abnormality of gait      Subjective Assessment - 06/20/15 1109    Subjective Pt returning to PT after undergoing R knee scope on 06/08/15 during which pt reports MD opted not to place stitch in meniscus due to good initial signs of healing but did perform debridement of posterior patella. Pt reports some residual pain and swelling since surgery but states this has been mild with pain typically no greater than 3/10 and usually only after being more active on her feet. Notes clicking observed prior to surgery has not returned since surgery. Pt reports MD has currenly restricted her to 25% standing during work.   Pertinent History R knee scope on 06/08/15 - debridement of posterior patella   Patient Stated Goals "Get back to training for running"   Currently in Pain? Yes   Pain Score 0-No pain  Least 0/10, Avg 3/10 after increased standing/activity, Worst 3/10   Pain Location Knee   Pain Orientation Right   Pain Descriptors / Indicators Aching   Pain Type Surgical pain   Pain Onset More than a month ago   Pain Frequency Intermittent   Aggravating Factors  Stairs, prolonged standing, squatting, weight bearing on R knee   Pain Relieving Factors Ice, pain meds   Effect of Pain on Daily Activities Restricted to 25% standing at work, Difficulty with reciprocal stair climbing (esp on descent)            [facility] PT Assessment - 06/20/15 1104    Assessment   Medical Diagnosis R patella fracture s/p knee scope 06/08/15   Onset Date/Surgical Date 06/08/15  R knee scope; initial injury 03/05/2015   Next MD Visit 07/04/15   Prior Therapy OP PT 04/24/15-05/14/15   Prior Function   Level of Independence Independent   Vocation Full time 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[employer] (was up to 7 miles in training for 10 mile run prior to injury)   Observation/Other Assessments  Focus on Therapeutic Outcomes (FOTO)  Knee - 45% (55% limitation); Predicted 64% (36% limitation)   ROM / Strength   AROM / PROM / Strength AROM;PROM;Strength   AROM   AROM Assessment Site Knee   Right/Left Knee Right   Right Knee Extension 2   Right Knee Flexion 135   PROM   PROM Assessment Site Knee   Right/Left Knee Right   Right Knee Extension -5   Right Knee Flexion 143   Strength   Strength Assessment Site Hip;Knee   Right/Left Hip Right   Right Hip Flexion 4+/5   Right Hip Extension 4/5   Right Hip ABduction 4/5   Right Hip ADduction 4/5   Right/Left Knee Right   Right Knee Flexion 4/5   Right Knee Extension  3+/5  pain with resistance   Flexibility   Hamstrings tight on R   Quadriceps tight on right, esp RF   ITB WNL          Today's Treatment  Re-eval   TherEx R hamstring and RF/quad stretches - pt instructed in 2 methods for each stretch Bridge with B Hip ADD ball squeeze x10, 3" hold  Sustainded Bridge + Alternating Unilateral Hip ABD/ER with Blue TB 2x5 L Side-lying R clam shells with Blue TB 10x3"  Seated Hamstring curl with Blue TB x10           PT Education - 06/20/15 1431    Education provided Yes   Education Details Updated hamstring & quad/RF stretches; resumed most recent HEP with blue TB    Person(s) Educated Patient   Methods Explanation;Demonstration;Handout   Comprehension Verbalized understanding;Returned demonstration             PT Long Term Goals - 06/20/15 1433    PT LONG TERM GOAL #1   Title Independent with HEP/gym program by 08/01/15   Status Revised   PT LONG TERM GOAL #2   Title Pt will demonstrate right knee ROM 0-140 or greater without pain by 08/01/15   Status Revised   PT LONG TERM GOAL #3   Title Pt will demonstrate right LE strength 5-/5 or greater by 08/01/15   Status Revised   PT LONG TERM GOAL #5   Title Pt will ascend/descend stairs reciprocally with normal step pattern by 08/01/15   Status Revised   PT LONG TERM GOAL #6   Title Pt will report performance of normal household chores and job tasks without limitation due to right knee by 08/01/15   Status Revised   PT LONG TERM GOAL #7   Title Pt will resume running at least 1 mile without increased R knee pain by 07/1715   Status Revised               Plan - 06/20/15 1437    Clinical Impression Statement Pt returning to OP PT after ~5 week break from PT during which pt underwent R knee arthroscopy with decbridement of posterior surface of patella on 06/08/15. Clicking sensation noted prior to surgery seems to have resolved with surgery and overall pt reporting no pain  greater than 3/10 typically but remains on restriction of 25% standing while working. R knee AROM currenly 2-135 with 0-142 available PROM. R hip strength currently 4/5 except 4+/5 for flexion and R knee strength 4/5 in hamstrings and 3+/5 in quads, with pt noting pain with resisted extension. Patient continues to demonstrate normal gait pattern on level surfaces but remains limited with reciprocal stair ascent and descent  due to pain and weakness creating sensation of potential for knee to give way. Patient has not yet been able to resume running due to persistent pain and continued instability, but would like to be able to do so before completing PT as she had been running up to 7 miles at a time prior to the injury and would like to resume training for a 10 mile race that she had planned to participate in prior to the injury. Recommend continued PT 2x/wk x 6 wks to continue focus on dynamic functional strengthening to restore PLOF and prepare patient to resume running program.                 Pt will benefit from skilled therapeutic intervention in order to improve on the following deficits Pain;Impaired flexibility;Increased edema;Decreased range of motion;Decreased strength;Difficulty walking;Abnormal gait;Decreased activity tolerance   Rehab Potential Excellent   PT Frequency 2x / week   PT Duration 6 weeks   PT Treatment/Interventions Therapeutic exercise;Manual techniques;Passive range of motion;Taping;Therapeutic activities;Balance training;Neuromuscular re-education;Gait training;Stair training;Cryotherapy;Vasopneumatic Device;Electrical Stimulation;Iontophoresis 4mg /ml Dexamethasone;Patient/family education   PT Next Visit Plan Right knee ROM, Right LE strengthening with progression of standing CKC exercises, Manual therapy including taping PRN for ROM and edema management, Modailities PRN   Consulted and Agree with Plan of Care Patient      Patient will benefit from skilled therapeutic  intervention in order to improve the following deficits and impairments:  Pain, Impaired flexibility, Increased edema, Decreased range of motion, Decreased strength, Difficulty walking, Abnormal gait, Decreased activity tolerance  Visit Diagnosis: Pain in right knee  (primary encounter diagnosis) Plan: PT plan of care cert/re-cert  Stiffness of right knee, not elsewhere classified Plan: PT plan of care cert/re-cert  Difficulty in walking, not elsewhere classified Plan: PT plan of care cert/re-cert  Other abnormalities of gait and mobility Plan: PT plan of care cert/re-cert    Problem List Patient Active Problem List   Diagnosis Date Noted  . Right knee injury 03/06/2015  . Left shoulder pain 01/11/2015  . Preventative health care 11/13/2014  . Lower back injury 03/18/2012    Percival Spanish, PT,MPT 06/20/2015, 2:56 PM  [facility] [address]  [place], [place], [address] Phone: [phone]   Fax:  [phone]  Name: [name] MRN: [id number] Date of Birth: 01-12-1976   Percival Spanish, PT, MPT 06/26/2015, 8:04 PM  [facility] [address]  [place], [place], [address] Phone: [phone]   Fax:  [phone]

## 2015-06-21 ENCOUNTER — Ambulatory Visit: Payer: PRIVATE HEALTH INSURANCE | Admitting: Physical Therapy

## 2015-06-21 DIAGNOSIS — M25661 Stiffness of right knee, not elsewhere classified: Secondary | ICD-10-CM

## 2015-06-21 DIAGNOSIS — R262 Difficulty in walking, not elsewhere classified: Secondary | ICD-10-CM

## 2015-06-21 DIAGNOSIS — M6281 Muscle weakness (generalized): Secondary | ICD-10-CM

## 2015-06-21 DIAGNOSIS — M25561 Pain in right knee: Secondary | ICD-10-CM | POA: Diagnosis not present

## 2015-06-21 DIAGNOSIS — R269 Unspecified abnormalities of gait and mobility: Secondary | ICD-10-CM

## 2015-06-21 NOTE — Therapy (Signed)
[facility] [address]  [place], [place], [address] Phone: [phone]   Fax:  [phone]  Physical Therapy Treatment  Patient Details  Name: [name] MRN: [id number] Date of Birth: Jun 15, 1975 Referring Provider: Kathalene Frames. Mayer Camel, MD  Encounter Date: 06/21/2015      PT End of Session - 06/21/15 1320    Visit Number 12   Number of Visits 22   Date for PT Re-Evaluation 08/01/15   Authorization Type Worker's comp   Authorization - Visit Number 2   Authorization - Number of Visits 12   PT Start Time [id number]   PT Stop Time [id number]   PT Time Calculation (min) 50 min   Activity Tolerance Patient tolerated treatment well;No increased pain   Behavior During Therapy [facility] for tasks assessed/performed      Past Medical History  Diagnosis Date  . H/O pyelonephritis 2001 & 2009  . Acute medial meniscus tear of right knee     Past Surgical History  Procedure Laterality Date  . Appendectomy  age 17  . Knee arthroscopy Right 06/08/2015    Procedure: RIGHT KNEE ARTHROSCOPY WITH DEBRIDEMENT;  Surgeon: Frederik Pear, MD;  Location: [facility];  Service: Orthopedics;  Laterality: Right;    There were no vitals filed for this visit.  Visit Diagnosis:  Right knee pain  Stiffness of right knee  Difficulty walking  Abnormality of gait  Muscle weakness (generalized)      Subjective Assessment - 06/21/15 1317    Subjective Pt noting increased soreness today, potentially associated with the inclement weather. Reports she just took some ibuprofen with lunch.   Currently in Pain? 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 miles in training for 10 mile run prior to injury)   Observation/Other Assessments   Focus on Therapeutic Outcomes (FOTO)  Knee - 45% (55% limitation); Predicted 64% (36% limitation)   ROM / Strength   AROM / PROM / Strength AROM;PROM;Strength   AROM   AROM Assessment Site Knee   Right/Left Knee Right   Right Knee Extension 2   Right Knee Flexion 135   PROM   PROM Assessment Site Knee   Right/Left Knee Right   Right Knee Extension -5   Right Knee Flexion 143   Strength   Strength Assessment Site Hip;Knee   Right/Left Hip Right   Right Hip Flexion 4+/5   Right Hip Extension 4/5   Right Hip ABduction 4/5   Right Hip ADduction 4/5   Right/Left Knee Right   Right Knee Flexion 4/5   Right Knee Extension 3+/5  pain with resistance   Flexibility   Hamstrings tight on R   Quadriceps tight on right, esp RF   ITB WNL  Today's Treatment  TherEx Rec Bike - lvl 2 x 5' Manual R hamstring and RF/quad stretches 2x30" each Sustainded Bridge + Alternating Unilateral Hip ABD/ER with Blue TB 2x5 L Side-lying R clam shells with Blue TB 10x3"  Straight leg Bridge with feet on orange (55 cm) Pball 10x3" Bridge + HS curl with feet on orange (55 cm) x10 Straight leg Bridge with feet on orange (55 cm) Pball + Alternating SLR x10 TKE with black TB 10x5" BATCA Leg Press 20# x10, 25# x10 BATCA Knee Flexion B 20# x10, B concentric/R eccentric 15# x10 TRX squat attempted but deferred d/t increased R anterior knee pain Fitter (2 blue) B Hip Abduction & Extension x10 each  Vasopnuematic compression to R knee with leg elevated on bolster - medium compression, lowest temp x10'          PT  Education - 06/20/15 1431    Education provided Yes   Education Details Updated hamstring & quad/RF stretches; resumed most recent HEP with blue TB    Person(s) Educated Patient   Methods Explanation;Demonstration;Handout   Comprehension Verbalized understanding;Returned demonstration             PT Long Term Goals - 06/21/15 1355    PT LONG TERM GOAL #1   Title Independent with HEP/gym program by 08/01/15   Status On-going   PT LONG TERM GOAL #2   Title Pt will demonstrate right knee ROM 0-140 or greater without pain by 08/01/15   Status On-going   PT LONG TERM GOAL #3   Title Pt will demonstrate right LE strength 5-/5 or greater by 08/01/15   Status On-going   PT LONG TERM GOAL #5   Title Pt will ascend/descend stairs reciprocally with normal step pattern by 08/01/15   Status On-going   PT LONG TERM GOAL #6   Title Pt will report performance of normal household chores and job tasks without limitation due to right knee by 08/01/15   Status On-going   PT LONG TERM GOAL #7   Title Pt will resume running at least 1 mile without increased R knee pain by 07/1715   Status On-going               Plan - 06/21/15 1357    Clinical Impression Statement Pt noting some increased pain today but did not take ibuprofen this morning due to not eating breakfast and also thinks it may be influenced by the inclement weather. Good tolerance for exercise progression today with no increased pain other than when attempting TRX partial squat (deferred). Treatment session completed with vasopnuematic compression to minimize post-exercise swelling and pain.   PT Next Visit Plan Right knee ROM/stretching, Right LE strengthening with progression of standing CKC exercises, Manual therapy including taping PRN for ROM and edema management, Modailities PRN   Consulted and Agree with Plan of Care Patient        Problem List Patient Active Problem List   Diagnosis Date Noted  . Right knee injury  03/06/2015  . Left shoulder pain 01/11/2015  . Preventative health care 11/13/2014  . Lower back injury 03/18/2012    Percival Spanish, PT, MPT 06/21/2015, 2:05 PM  [facility] [address]  [place], [place], [address] Phone: [phone]   Fax:  [phone]  Name: [name] MRN: [id number] Date of Birth: 07-26-1975

## 2015-06-22 ENCOUNTER — Encounter (HOSPITAL_BASED_OUTPATIENT_CLINIC_OR_DEPARTMENT_OTHER): Payer: Self-pay | Admitting: Orthopedic Surgery

## 2015-06-25 ENCOUNTER — Ambulatory Visit: Payer: PRIVATE HEALTH INSURANCE

## 2015-06-25 DIAGNOSIS — M25561 Pain in right knee: Secondary | ICD-10-CM | POA: Diagnosis not present

## 2015-06-25 DIAGNOSIS — R262 Difficulty in walking, not elsewhere classified: Secondary | ICD-10-CM

## 2015-06-25 DIAGNOSIS — M25661 Stiffness of right knee, not elsewhere classified: Secondary | ICD-10-CM

## 2015-06-25 DIAGNOSIS — R2689 Other abnormalities of gait and mobility: Secondary | ICD-10-CM

## 2015-06-25 NOTE — Therapy (Addendum)
[facility] [address]  [place], [place], [address] Phone: [phone]   Fax:  [phone]  Physical Therapy Treatment  Patient Details  Name: [name] MRN: [id number] Date of Birth: 09/12/1975 Referring Provider: Kathalene Frames. Mayer Camel, MD  Encounter Date: 06/25/2015      PT End of Session - 06/25/15 0904    Visit Number 13   Number of Visits 22   Date for PT Re-Evaluation 08/01/15   Authorization Type Worker's comp   Authorization - Visit Number 3   Authorization - Number of Visits 12   PT Start Time [phone]   PT Stop Time 0945   PT Time Calculation (min) 59 min   Activity Tolerance Patient tolerated treatment well;No increased pain   Behavior During Therapy [facility] for tasks assessed/performed      Past Medical History  Diagnosis Date  . H/O pyelonephritis 2001 & 2009  . Acute medial meniscus tear of right knee     Past Surgical History  Procedure Laterality Date  . Appendectomy  age 65  . Knee arthroscopy Right 06/08/2015    Procedure: RIGHT KNEE ARTHROSCOPY WITH DEBRIDEMENT;  Surgeon: Frederik Pear, MD;  Location: [facility];  Service: Orthopedics;  Laterality: Right;    There were no vitals filed for this visit.      Subjective Assessment - 06/25/15 0850    Subjective Pt. reports no right knee pain currently.  Pt. reports walking a lot over the weekend and feeling better.     Patient Stated Goals "Get back to training for running"   Currently in Pain? No/denies   Pain Score 0-No pain   Multiple Pain Sites No      Today's Treatment  TherEx NuStep:  Isometric double leg stance on BOSU ball (down) 2 x 30 sec with 6#  Manual R hamstring and RF/quad stretches 2x30" each Sustainded Bridge + Alternating Unilateral Hip ABD/ER with TB 2 x 5 reps 4" R quad eccentric step down x 4 reps; defered due to pt. R knee pain Straight leg Bridge with feet on orange (55 cm) Pball 10x3" Bridge + HS  curl with feet on orange (55 cm) x10 Alternating single leg bridge x 10 reps each side with adduction ball squeeze  TKE with black TB 15x5" Fitter (2 blue) B Hip Abduction & Extension x 10 each   Vasopnuematic compression to R knee with leg elevated on bolster - medium compression, lowest temp x10'         PT Long Term Goals - 06/21/15 1355    PT LONG TERM GOAL #1   Title Independent with HEP/gym program by 08/01/15   Status On-going   PT LONG TERM GOAL #2   Title Pt will demonstrate right knee ROM 0-140 or greater without pain by 08/01/15   Status On-going   PT LONG TERM GOAL #3   Title Pt will demonstrate right LE strength 5-/5 or greater by 08/01/15   Status On-going   PT LONG TERM GOAL #5   Title Pt will ascend/descend stairs reciprocally with normal step pattern by 08/01/15   Status On-going   PT LONG TERM GOAL #6   Title Pt will report performance of normal household chores and job tasks without limitation due to right knee by 08/01/15   Status On-going   PT LONG TERM GOAL #7   Title Pt will resume running at least 1 mile without increased R knee pain by 07/1715  Status On-going               Plan - 06/25/15 0905    Clinical Impression Statement Pt. tolerated all hip / knee strengthening activity well today with only mild R knee pain increase with functional squating activity.  Pt. progressing well with CKC activity.     PT Treatment/Interventions Therapeutic exercise;Manual techniques;Passive range of motion;Taping;Therapeutic activities;Balance training;Neuromuscular re-education;Gait training;Stair training;Cryotherapy;Vasopneumatic Device;Electrical Stimulation;Iontophoresis 4mg /ml Dexamethasone;Patient/family education   PT Next Visit Plan Right knee ROM/stretching, Right LE strengthening with progression of standing CKC exercises, Manual therapy including taping PRN for ROM and edema management, Modailities PRN   Consulted and Agree with Plan of Care Patient       Patient will benefit from skilled therapeutic intervention in order to improve the following deficits and impairments:  Pain, Impaired flexibility, Increased edema, Decreased range of motion, Decreased strength, Difficulty walking, Abnormal gait, Decreased activity tolerance  Visit Diagnosis: Pain in right knee  Stiffness of right knee, not elsewhere classified  Difficulty in walking, not elsewhere classified  Other abnormalities of gait and mobility     Problem List Patient Active Problem List   Diagnosis Date Noted  . Right knee injury 03/06/2015  . Left shoulder pain 01/11/2015  . Preventative health care 11/13/2014  . Lower back injury 03/18/2012    [name], PTA 06/25/2015, 7:32 PM  [facility] [address]  [place], [place], [address] Phone: [phone]   Fax:  [phone]  Name: [name] MRN: [id number] Date of Birth: 01-Nov-1975   Percival Spanish, PT, MPT 06/26/2015, 8:07 PM  [facility] [address]  [place], [place], [address] Phone: [phone]   Fax:  [phone]

## 2015-06-26 NOTE — Addendum Note (Signed)
Addended by: Percival Spanish on: 06/26/2015 08:05 PM   Modules accepted: Orders

## 2015-06-28 ENCOUNTER — Ambulatory Visit: Payer: PRIVATE HEALTH INSURANCE

## 2015-06-28 DIAGNOSIS — R262 Difficulty in walking, not elsewhere classified: Secondary | ICD-10-CM

## 2015-06-28 DIAGNOSIS — M25661 Stiffness of right knee, not elsewhere classified: Secondary | ICD-10-CM

## 2015-06-28 DIAGNOSIS — M25561 Pain in right knee: Secondary | ICD-10-CM

## 2015-06-28 DIAGNOSIS — R2689 Other abnormalities of gait and mobility: Secondary | ICD-10-CM

## 2015-06-28 NOTE — Therapy (Signed)
[facility] [address]  [place], [place], [address] Phone: [phone]   Fax:  [phone]  Physical Therapy Treatment  Patient Details  Name: [name] MRN: [id number] Date of Birth: May 30, 1975 Referring Provider: Kathalene Frames. Mayer Camel, MD  Encounter Date: 06/28/2015      PT End of Session - 06/28/15 1409    Visit Number 14   Number of Visits 22   Date for PT Re-Evaluation 08/01/15   Authorization Type Worker's comp   Authorization - Visit Number 4   Authorization - Number of Visits 12   PT Start Time [id number]   PT Stop Time 1445   PT Time Calculation (min) 40 min   Activity Tolerance Patient tolerated treatment well   Behavior During Therapy WFL for tasks assessed/performed      Past Medical History  Diagnosis Date  . H/O pyelonephritis 2001 & 2009  . Acute medial meniscus tear of right knee     Past Surgical History  Procedure Laterality Date  . Appendectomy  age 78  . Knee arthroscopy Right 06/08/2015    Procedure: RIGHT KNEE ARTHROSCOPY WITH DEBRIDEMENT;  Surgeon: Frederik Pear, MD;  Location: [facility];  Service: Orthopedics;  Laterality: Right;    There were no vitals filed for this visit.      Subjective Assessment - 06/28/15 1407    Subjective Pt. reports no R knee pain currently, and that she tried the rowing machine at home yesterday and it left her slightly sore however feeling good.     Patient Stated Goals "Get back to training for running"   Currently in Pain? No/denies   Pain Score 0-No pain   Multiple Pain Sites No      Today's Treatment:  TherEx: Recumbent bike: level 3, 3 min  Manual:  R HS, piri, SKTC, PF, stretch x 30 sec   Therex: Bridging x 15 reps Bridge with HS curl combo with heels on peanut p-ball x 10 reps  Sustainded Bridge + Alternating Unilateral Hip ABD/ER with TB 3 x 5 reps B sidelying clam shell with blue TB around knees x 15 reps  R  Dorsiflexion rocker stretch x 30 sec  BATCA HS curl 23# x 15 reps   Isometric double leg stance on BOSU ball (down) x 30 sec with 6#  TKE with black TB 15x5"  Vasopnuematic compression to R knee with leg elevated on bolster - medium compression, lowest temp x15'        PT Long Term Goals - 06/21/15 1355    PT LONG TERM GOAL #1   Title Independent with HEP/gym program by 08/01/15   Status On-going   PT LONG TERM GOAL #2   Title Pt will demonstrate right knee ROM 0-140 or greater without pain by 08/01/15   Status On-going   PT LONG TERM GOAL #3   Title Pt will demonstrate right LE strength 5-/5 or greater by 08/01/15   Status On-going   PT LONG TERM GOAL #5   Title Pt will ascend/descend stairs reciprocally with normal step pattern by 08/01/15   Status On-going   PT LONG TERM GOAL #6   Title Pt will report performance of normal household chores and job tasks without limitation due to right knee by 08/01/15   Status On-going   PT LONG TERM GOAL #7   Title Pt will resume running at least 1 mile without increased R knee pain by 07/1715  Status On-going               Plan - 06/28/15 1409    Clinical Impression Statement Pt. performed well with all functional stepping and squating activities; however continues to be limited on squating depth due to R knee pain ~40 dg into squat.  Pt. reports being on her feet at her job more than expected leaving her R LE tired the last few days.     PT Treatment/Interventions Therapeutic exercise;Manual techniques;Passive range of motion;Taping;Therapeutic activities;Balance training;Neuromuscular re-education;Gait training;Stair training;Cryotherapy;Vasopneumatic Device;Electrical Stimulation;Iontophoresis 4mg /ml Dexamethasone;Patient/family education   PT Next Visit Plan Right knee ROM, Right LE strengthening with progression of standing CKC exercises, Manual therapy including taping PRN for ROM and edema management, Modailities PRN       Patient will benefit from skilled therapeutic intervention in order to improve the following deficits and impairments:  Pain, Impaired flexibility, Increased edema, Decreased range of motion, Decreased strength, Difficulty walking, Abnormal gait, Decreased activity tolerance  Visit Diagnosis: Pain in right knee  Stiffness of right knee, not elsewhere classified  Difficulty in walking, not elsewhere classified  Other abnormalities of gait and mobility     Problem List Patient Active Problem List   Diagnosis Date Noted  . Right knee injury 03/06/2015  . Left shoulder pain 01/11/2015  . Preventative health care 11/13/2014  . Lower back injury 03/18/2012    [name] PTA 06/28/2015, 5:02 PM  [facility] [address]  [place], [place], [address] Phone: [phone]   Fax:  [phone]  Name: [name] MRN: [id number] Date of Birth: June 21, 1975

## 2015-07-02 ENCOUNTER — Ambulatory Visit: Payer: PRIVATE HEALTH INSURANCE

## 2015-07-02 DIAGNOSIS — R262 Difficulty in walking, not elsewhere classified: Secondary | ICD-10-CM

## 2015-07-02 DIAGNOSIS — M25561 Pain in right knee: Secondary | ICD-10-CM | POA: Diagnosis not present

## 2015-07-02 DIAGNOSIS — R2689 Other abnormalities of gait and mobility: Secondary | ICD-10-CM

## 2015-07-02 DIAGNOSIS — M25661 Stiffness of right knee, not elsewhere classified: Secondary | ICD-10-CM

## 2015-07-02 NOTE — Therapy (Signed)
[facility] [address]  [place], [place], [address] Phone: [phone]   Fax:  [phone]  Physical Therapy Treatment  Patient Details  Name: [name] MRN: [id number] Date of Birth: Feb 13, 1976 Referring Provider: Kathalene Frames. Mayer Camel, MD  Encounter Date: 07/02/2015      PT End of Session - 07/02/15 0851    Visit Number 15   Number of Visits 22   Date for PT Re-Evaluation 08/01/15   Authorization Type Worker's comp   Authorization - Visit Number 5   Authorization - Number of Visits 12   PT Start Time [id number]   PT Stop Time 0945   PT Time Calculation (min) 59 min   Activity Tolerance Patient tolerated treatment well   Behavior During Therapy WFL for tasks assessed/performed      Past Medical History  Diagnosis Date  . H/O pyelonephritis 2001 & 2009  . Acute medial meniscus tear of right knee     Past Surgical History  Procedure Laterality Date  . Appendectomy  age 32  . Knee arthroscopy Right 06/08/2015    Procedure: RIGHT KNEE ARTHROSCOPY WITH DEBRIDEMENT;  Surgeon: Frederik Pear, MD;  Location: [facility];  Service: Orthopedics;  Laterality: Right;    There were no vitals filed for this visit.      Subjective Assessment - 07/02/15 0849    Subjective Pt. reports 2/10 R knee pain today with increased swelling and pressure.     Patient Stated Goals "Get back to training for running"   Currently in Pain? Yes   Pain Score 2    Pain Location Knee   Pain Orientation Right   Pain Descriptors / Indicators Aching;Sore   Pain Type Surgical pain   Pain Radiating Towards n/a   Pain Onset More than a month ago   Pain Frequency Intermittent   Aggravating Factors  stairs, prolonged standing, squatting, weight bearing on R knee   Pain Relieving Factors ice, pain meds   Multiple Pain Sites No            [facility] PT Assessment - 07/02/15 0903    AROM   AROM Assessment Site Knee   Right/Left  Knee Right   Right Knee Extension 0   Right Knee Flexion 142   Strength   Strength Assessment Site Knee   Right/Left Hip Right   Right Knee Flexion 4+/5  5-/5   Right Knee Extension 4-/5       Today's Treatment:  TherEx: NuStep level 3, 4 min   Manual:  R HS, piri, SKTC, PF, stretch x 30 sec   Therex: Bridge with HS curl combo with heels on peanut p-ball x 10 reps B Fitter hip ext./abd x 10 each way; 2 poles UE support Sustainded Bridge + Alternating Unilateral Hip ABD/ER with TB x 10  reps  B sidelying clam shell with black TB around knees x 15 reps  R Dorsiflexion rocker stretch x 30 sec  BATCA HS curl 35 x 15 reps  TKE with black TB 15x5" R leg step up on 4" with black TB around knee x 30 resp  Vasopnuematic compression to R knee with leg elevated on bolster - medium compression, lowest temp x10'        PT Long Term Goals - 07/02/15 0175    PT LONG TERM GOAL #1   Title Independent with HEP/gym program by 08/01/15   Status On-going   PT LONG TERM GOAL #  2   Title Pt will demonstrate right knee ROM 0-140 or greater without pain by 08/01/15   Status Achieved  07/02/15:  Pt. able to demo R knee AROM 0- 142  dg.     PT LONG TERM GOAL #3   Title Pt will demonstrate right LE strength 5-/5 or greater by 08/01/15   Status Partially Met  07/02/15: Pt. able to demo 5-/5 with R knee flexion strength, however R knee ext. 4/5 strength with pain.     PT LONG TERM GOAL #4   Title Pt will ambulate with normal gait pattern without AD on all surfaces by 05/22/15   Status Achieved   PT LONG TERM GOAL #5   Title Pt will ascend/descend stairs reciprocally with normal step pattern by 08/01/15   Status Partially Met  07/02/15: Pt. able to ascend stairs reciprocally with normal step pattern, however  only able to descend with step-to pattern and use of 1 handrail.     PT LONG TERM GOAL #6   Title Pt will report performance of normal household chores and job tasks without limitation due  to right knee by 08/01/15   Status On-going   PT LONG TERM GOAL #7   Title Pt will resume running at least 1 mile without increased R knee pain by 07/1715   Status On-going               Plan - 07/02/15 0852    Clinical Impression Statement Pt. with increased R knee swelling today with initial R knee pain 2/10.  Pt. reports being on her feet a lot over the weekend and feeling worse today than usual.  Pt. tolerated today's treatment of CKC TKE strengthening well however continues to experience pain with functional squating and advanced TKE strengthening.  Pt. albe to ascend stairs with reciprocal gait pattern, however descend with step-to pattern requiring 1 handrail with mild R knee pain reported.     PT Treatment/Interventions Therapeutic exercise;Manual techniques;Passive range of motion;Taping;Therapeutic activities;Balance training;Neuromuscular re-education;Gait training;Stair training;Cryotherapy;Vasopneumatic Device;Electrical Stimulation;Iontophoresis '4mg'$ /ml Dexamethasone;Patient/family education   PT Next Visit Plan Right knee ROM, Right LE strengthening with progression of standing CKC exercises, Manual therapy including taping PRN for ROM and edema management, Modailities PRN      Patient will benefit from skilled therapeutic intervention in order to improve the following deficits and impairments:  Pain, Impaired flexibility, Increased edema, Decreased range of motion, Decreased strength, Difficulty walking, Abnormal gait, Decreased activity tolerance  Visit Diagnosis: Pain in right knee  Stiffness of right knee, not elsewhere classified  Difficulty in walking, not elsewhere classified  Other abnormalities of gait and mobility     Problem List Patient Active Problem List   Diagnosis Date Noted  . Right knee injury 03/06/2015  . Left shoulder pain 01/11/2015  . Preventative health care 11/13/2014  . Lower back injury 03/18/2012    [name], PTA 07/02/2015, 3:32  PM  [facility] [address]  [place], [place], [address] Phone: [phone]   Fax:  [phone]  Name: [name] MRN: [id number] Date of Birth: 07/05/1975

## 2015-07-05 ENCOUNTER — Ambulatory Visit: Payer: PRIVATE HEALTH INSURANCE | Admitting: Physical Therapy

## 2015-07-05 DIAGNOSIS — M25561 Pain in right knee: Secondary | ICD-10-CM

## 2015-07-05 DIAGNOSIS — R2689 Other abnormalities of gait and mobility: Secondary | ICD-10-CM

## 2015-07-05 DIAGNOSIS — M25661 Stiffness of right knee, not elsewhere classified: Secondary | ICD-10-CM

## 2015-07-05 DIAGNOSIS — R262 Difficulty in walking, not elsewhere classified: Secondary | ICD-10-CM

## 2015-07-05 NOTE — Therapy (Signed)
[facility] [address]  [place], [place], [address] Phone: [phone]   Fax:  [phone]  Physical Therapy Treatment  Patient Details  Name: [name] MRN: [id number] Date of Birth: 1975/04/05 Referring Provider: Kathalene Frames. Mayer Camel, MD  Encounter Date: 07/05/2015      PT End of Session - 07/05/15 0852    Visit Number 16   Number of Visits 22   Date for PT Re-Evaluation 08/01/15   Authorization Type Worker's comp   Authorization - Visit Number 6   Authorization - Number of Visits 12   PT Start Time 0845   PT Stop Time 0929   PT Time Calculation (min) 44 min   Activity Tolerance Patient tolerated treatment well   Behavior During Therapy WFL for tasks assessed/performed      Past Medical History  Diagnosis Date  . H/O pyelonephritis 2001 & 2009  . Acute medial meniscus tear of right knee     Past Surgical History  Procedure Laterality Date  . Appendectomy  age 58  . Knee arthroscopy Right 06/08/2015    Procedure: RIGHT KNEE ARTHROSCOPY WITH DEBRIDEMENT;  Surgeon: Frederik Pear, MD;  Location: [place];  Service: Orthopedics;  Laterality: Right;    There were no vitals filed for this visit.      Subjective Assessment - 07/05/15 0849    Subjective Pt saw MD yesterday and he lifted her work restriction for standing to 50%. Per MD, able to complete all activities as tolerated except running and jumping.   Currently in Pain? No/denies            [facility] PT Assessment - 07/05/15 0845    Assessment   Medical Diagnosis R patella fracture s/p knee scope 06/08/15   Onset Date/Surgical Date 06/08/15  R knee scope; initial injury 03/05/2015   Next MD Visit 08/08/15         Today's Treatment  TherEx Rec Bike - interval L3-5 x 5'  Manual R HS, piri, SKTC, PF, stretch x 30 sec - flexibility WNL for all  TherEx Straight leg bridge with heels on orange (55 cm) Pball 10x3" Bridge  + HS curl with heels on orange (55 cm) Pball x10 Bridge + Alternating SLR with heels on orange (55 cm) Pball x10 R Side-lying Bridge to [employer] position on elbow x10 Fitter B Hip Abduction & Extension (1 black/1 blue) x10 each, 1 pole assist R SLS rotational stabilization with red TB x10 each med/lat BATCA Knee Flexion B 35# x10, B concentric/R eccentric 25# x10 BATCA Leg Press 25# x10, 35# x10 R 6" Step-up + TKE with black TB x10 R SLS on blue foam Airex 3x20" Tandem stance on blue foam Airex with alternating fwd foot 3x20" each           PT Long Term Goals - 07/02/15 0857    PT LONG TERM GOAL #1   Title Independent with HEP/gym program by 08/01/15   Status On-going   PT LONG TERM GOAL #2   Title Pt will demonstrate right knee ROM 0-140 or greater without pain by 08/01/15   Status Achieved  07/02/15:  Pt. able to demo R knee AROM 0- 142  dg.     PT LONG TERM GOAL #3   Title Pt will demonstrate right LE strength 5-/5 or greater by 08/01/15   Status Partially Met  07/02/15: Pt. able to demo 5-/5 with R knee flexion strength, however R  knee ext. 4/5 strength with pain.     PT LONG TERM GOAL #4   Title Pt will ambulate with normal gait pattern without AD on all surfaces by 05/22/15   Status Achieved   PT LONG TERM GOAL #5   Title Pt will ascend/descend stairs reciprocally with normal step pattern by 08/01/15   Status Partially Met  07/02/15: Pt. able to ascend stairs reciprocally with normal step pattern, however  only able to descend with step-to pattern and use of 1 handrail.     PT LONG TERM GOAL #6   Title Pt will report performance of normal household chores and job tasks without limitation due to right knee by 08/01/15   Status On-going   PT LONG TERM GOAL #7   Title Pt will resume running at least 1 mile without increased R knee pain by 07/1715   Status On-going               Plan - 07/05/15 0918    Clinical Impression Statement Pt without pain today after more restful  day yesterday. Able to tolerate progression of exercises both with resistance and degree of difficulty without complaints other than mild fatigue. Overall pt continues to demonstrate consistent progress with restoration of full R knee ROM and improving R knee strength by at least 1/2 grade since re-eval after surgery. She will continue to benefit from skillled PT to restore full strength in R LE along with continued stability/proprioceptive training to prepare her to return to PLOF including running.   PT Treatment/Interventions Therapeutic exercise;Manual techniques;Passive range of motion;Taping;Therapeutic activities;Balance training;Neuromuscular re-education;Gait training;Stair training;Cryotherapy;Vasopneumatic Device;Electrical Stimulation;Iontophoresis '4mg'$ /ml Dexamethasone;Patient/family education   PT Next Visit Plan Right knee ROM, Right LE strengthening with progression of standing CKC exercises, Manual therapy including taping PRN for ROM and edema management, Modailities PRN   Consulted and Agree with Plan of Care Patient      Patient will benefit from skilled therapeutic intervention in order to improve the following deficits and impairments:  Pain, Impaired flexibility, Increased edema, Decreased range of motion, Decreased strength, Difficulty walking, Abnormal gait, Decreased activity tolerance  Visit Diagnosis: Pain in right knee  Stiffness of right knee, not elsewhere classified  Difficulty in walking, not elsewhere classified  Other abnormalities of gait and mobility     Problem List Patient Active Problem List   Diagnosis Date Noted  . Right knee injury 03/06/2015  . Left shoulder pain 01/11/2015  . Preventative health care 11/13/2014  . Lower back injury 03/18/2012    Percival Spanish, PT, MPT 07/05/2015, 11:41 AM  [facility] [address]  [place], [place], [address] Phone: [phone]   Fax:   [phone]  Name: [name] MRN: [id number] Date of Birth: [birth year]

## 2015-07-09 ENCOUNTER — Ambulatory Visit: Payer: PRIVATE HEALTH INSURANCE

## 2015-07-09 DIAGNOSIS — M25561 Pain in right knee: Secondary | ICD-10-CM | POA: Diagnosis not present

## 2015-07-09 DIAGNOSIS — M25661 Stiffness of right knee, not elsewhere classified: Secondary | ICD-10-CM

## 2015-07-09 DIAGNOSIS — R2689 Other abnormalities of gait and mobility: Secondary | ICD-10-CM

## 2015-07-09 NOTE — Therapy (Signed)
[facility] [address]  [place], [place], [address] Phone: [phone]   Fax:  [phone]  Physical Therapy Treatment  Patient Details  Name: [name] MRN: [id number] Date of Birth: 1975/11/14 Referring Provider: Kathalene Frames. Mayer Camel, MD  Encounter Date: 07/09/2015      PT End of Session - 07/09/15 0849    Visit Number 17   Number of Visits 22   Date for PT Re-Evaluation 08/01/15   Authorization Type Worker's comp   Authorization - Visit Number 7   Authorization - Number of Visits 12   PT Start Time [phone]   PT Stop Time 0930   PT Time Calculation (min) 44 min   Activity Tolerance Patient tolerated treatment well   Behavior During Therapy WFL for tasks assessed/performed      Past Medical History  Diagnosis Date  . H/O pyelonephritis 2001 & 2009  . Acute medial meniscus tear of right knee     Past Surgical History  Procedure Laterality Date  . Appendectomy  age 35  . Knee arthroscopy Right 06/08/2015    Procedure: RIGHT KNEE ARTHROSCOPY WITH DEBRIDEMENT;  Surgeon: Frederik Pear, MD;  Location: [place];  Service: Orthopedics;  Laterality: Right;    There were no vitals filed for this visit.      Subjective Assessment - 07/09/15 0859    Subjective Pt. reports no R knee pain today initially.  However reports pain while carrying kids down the stairs over the weekend.     Patient Stated Goals "Get back to training for running"   Currently in Pain? No/denies   Pain Score 0-No pain   Multiple Pain Sites No     Today's Treatment:  TherEx: Rec Bike - interval L3-5 x 5'  Manual: R HS, SKTC, stretch x 30 sec - flexibility WNL for all Patellar mobs each way   TherEx: Bridge + HS curl with heels on peanut p-ball x 10 reps Fitter B Hip Abduction & Extension (1 black/1 blue) x10 each, 1 pole assist; slight pain increase with R stance leg activity R SLS rotational stabilization with  red TB x10 each med/lat BATCA Knee Flexion B 35# x10, B concentric/R eccentric 25# x8 Dorsiflexion rocker stretch x 30 sec R 6" Step-up + TKE with black TB x15; could only tolerated mild TB resistance around knee R SLS on blue foam Airex 3x30" BATCA Cable machine step back x 8 steps x 5 back / forward with B retraction   Vasoneumatic Device: coldest temp., supine, R LE elevated, medium compression, 15',         PT Long Term Goals - 07/02/15 0857    PT LONG TERM GOAL #1   Title Independent with HEP/gym program by 08/01/15   Status On-going   PT LONG TERM GOAL #2   Title Pt will demonstrate right knee ROM 0-140 or greater without pain by 08/01/15   Status Achieved  07/02/15:  Pt. able to demo R knee AROM 0- 142  dg.     PT LONG TERM GOAL #3   Title Pt will demonstrate right LE strength 5-/5 or greater by 08/01/15   Status Partially Met  07/02/15: Pt. able to demo 5-/5 with R knee flexion strength, however R knee ext. 4/5 strength with pain.     PT LONG TERM GOAL #4   Title Pt will ambulate with normal gait pattern without AD on all surfaces by 05/22/15   Status  Achieved   PT LONG TERM GOAL #5   Title Pt will ascend/descend stairs reciprocally with normal step pattern by 08/01/15   Status Partially Met  07/02/15: Pt. able to ascend stairs reciprocally with normal step pattern, however  only able to descend with step-to pattern and use of 1 handrail.     PT LONG TERM GOAL #6   Title Pt will report performance of normal household chores and job tasks without limitation due to right knee by 08/01/15   Status On-going   PT LONG TERM GOAL #7   Title Pt will resume running at least 1 mile without increased R knee pain by 07/1715   Status On-going               Plan - 07/09/15 0850    Clinical Impression Statement Pt. with 0/10 R knee pain initially however reports she felt R knee pain descending stairs at the house yesterday.  Pt. performed well with all TKE and single leg strengthening  activity today however reported pain increase up to 2/10 at R knee with R single leg stance activity and eccentric R quad activity.     PT Treatment/Interventions Therapeutic exercise;Manual techniques;Passive range of motion;Taping;Therapeutic activities;Balance training;Neuromuscular re-education;Gait training;Stair training;Cryotherapy;Vasopneumatic Device;Electrical Stimulation;Iontophoresis '4mg'$ /ml Dexamethasone;Patient/family education   PT Next Visit Plan Right knee ROM, Right LE strengthening with progression of standing CKC exercises, Manual therapy including taping PRN for ROM and edema management, Modailities PRN   Consulted and Agree with Plan of Care --      Patient will benefit from skilled therapeutic intervention in order to improve the following deficits and impairments:  Pain, Impaired flexibility, Increased edema, Decreased range of motion, Decreased strength, Difficulty walking, Abnormal gait, Decreased activity tolerance  Visit Diagnosis: Pain in right knee  Stiffness of right knee, not elsewhere classified  Other abnormalities of gait and mobility     Problem List Patient Active Problem List   Diagnosis Date Noted  . Right knee injury 03/06/2015  . Left shoulder pain 01/11/2015  . Preventative health care 11/13/2014  . Lower back injury 03/18/2012    [name], PTA 07/09/2015, 12:51 PM  [facility] [address]  [place], [place], [address] Phone: [phone]   Fax:  [phone]  Name: [name] MRN: [id number] Date of Birth: [birth year]

## 2015-07-12 ENCOUNTER — Ambulatory Visit: Payer: PRIVATE HEALTH INSURANCE | Admitting: Physical Therapy

## 2015-07-12 DIAGNOSIS — R262 Difficulty in walking, not elsewhere classified: Secondary | ICD-10-CM

## 2015-07-12 DIAGNOSIS — M25561 Pain in right knee: Secondary | ICD-10-CM | POA: Diagnosis not present

## 2015-07-12 DIAGNOSIS — M25661 Stiffness of right knee, not elsewhere classified: Secondary | ICD-10-CM

## 2015-07-12 DIAGNOSIS — R2689 Other abnormalities of gait and mobility: Secondary | ICD-10-CM

## 2015-07-12 NOTE — Therapy (Signed)
[facility] [address]  [place], [place], [address] Phone: [phone]   Fax:  [phone]  Physical Therapy Treatment  Patient Details  Name: [name] MRN: [id number] Date of Birth: [birth year] Referring Provider: Kathalene Frames. Mayer Camel, MD  Encounter Date: 07/12/2015      PT End of Session - 07/12/15 0851    Visit Number 18   Number of Visits 30  8 additional visits approved   Date for PT Re-Evaluation 08/09/15   Authorization Type Worker's comp   Authorization - Visit Number 8   Authorization - Number of Visits 20  8 additional visits approved (4 remaining of initial 12 visits authorized)   PT Start Time 0847   PT Stop Time 0928   PT Time Calculation (min) 41 min   Activity Tolerance Patient tolerated treatment well   Behavior During Therapy [facility] for tasks assessed/performed      Past Medical History  Diagnosis Date  . H/O pyelonephritis 2001 & 2009  . Acute medial meniscus tear of right knee     Past Surgical History  Procedure Laterality Date  . Appendectomy  age 40  . Knee arthroscopy Right 06/08/2015    Procedure: RIGHT KNEE ARTHROSCOPY WITH DEBRIDEMENT;  Surgeon: Frederik Pear, MD;  Location: [place];  Service: Orthopedics;  Laterality: Right;    There were no vitals filed for this visit.      Subjective Assessment - 07/12/15 0849    Subjective Pt reports R knee pain has been better controlled and has not needed to take the Ibuprofen this week. Notes some muscle soreness after therapy workouts but no knee pain.   Currently in Pain? No/denies            [facility] PT Assessment - 07/12/15 0847    ROM / Strength   AROM / PROM / Strength AROM;Strength   AROM   AROM Assessment Site Knee   Right/Left Knee Right   Right Knee Extension -2   Right Knee Flexion 146   Strength   Strength Assessment Site Hip;Knee   Right/Left Hip Right   Right Hip Flexion 4+/5   Right Hip  Extension 4+/5   Right Hip ABduction 4/5   Right Hip ADduction 4/5   Right/Left Knee Right   Right Knee Flexion 4+/5  5-/5   Right Knee Extension 4/5  pain with resistance          Today's Treatment  TherEx NuStep - lvl 5 x 5' (NuStep used in place of recumbent bike as pt forgot her sneakers)  MMT & ROM check  R Fwd Step-up to BOSU (up) 15x3" R Lat Step-up to BOSU (up) 15x3" BOSU (inverted) squat with 6# db held at ~90 dg shoulder flexion for upright posture x10 R SLS rotational stabilization on blue foam oval with green TB x10 each med/lat B Doorway Lunge x10 each B Side-stepping in partial squat with green TB x20 ft Monster walk Fwd/Back with green TB x20 ft         PT Long Term Goals - 07/12/15 5053    PT LONG TERM GOAL #1   Title Independent with HEP/gym program by 08/09/15   Status On-going   PT LONG TERM GOAL #2   Title Pt will demonstrate right knee ROM 0-140 or greater without pain by 08/01/15   Status Achieved  07/12/15:  Pt. able to demo R knee AROM 0- 146  dg.  PT LONG TERM GOAL #3   Title Pt will demonstrate right LE strength 5-/5 or greater by 08/09/15   Status Partially Met  07/12/15: Pt. able to demo 5-/5 with R knee flexion strength, however R knee ext. 4/5 strength with pain on resistance.     PT LONG TERM GOAL #4   Title --   Status --   PT LONG TERM GOAL #5   Title Pt will ascend/descend stairs reciprocally with normal step pattern by 08/09/15   Status Partially Met  07/12/15: Pt. able to ascend stairs reciprocally with normal step pattern w/o use of rail, however still demos hesistation with eccentric lowering on R and slight hip susbstitution.   PT LONG TERM GOAL #6   Title Pt will report performance of normal household chores and job tasks without limitation due to right knee by 08/09/15   Status On-going   PT LONG TERM GOAL #7   Title Pt will resume running at least 1 mile without increased R knee pain by 07/2515   Status On-going   PT LONG  TERM GOAL #8   Title --               Plan - 07/12/15 1558    Clinical Impression Statement Pt demonstrating good progress with PT with decreasing pain to the point where pt can go without taking Ibuprofen the majority of the time over the past week. R knee ROM WFL and nearing full ROM equivalent to L. R LE strength improving but remains most limited in quads (pain with MMT resistance) and hip abd/adductors. Function also improving with pt able to consistently ascend/descend stairs with reciprocal pattern, although unable to carry any weight when descending stairs. Pt is not yet ready to resume running activities, which is her primary goal. Approval received from worker's comp for additional 8 visits of PT.   PT Treatment/Interventions Therapeutic exercise;Manual techniques;Passive range of motion;Taping;Therapeutic activities;Balance training;Neuromuscular re-education;Gait training;Stair training;Cryotherapy;Vasopneumatic Device;Electrical Stimulation;Iontophoresis 40m/ml Dexamethasone;Patient/family education   PT Next Visit Plan Right knee ROM, Right LE strengthening with progression of standing CKC exercises, Manual therapy including taping PRN for ROM and edema management, Modailities PRN   Consulted and Agree with Plan of Care Patient      Patient will benefit from skilled therapeutic intervention in order to improve the following deficits and impairments:  Pain, Impaired flexibility, Increased edema, Decreased range of motion, Decreased strength, Difficulty walking, Abnormal gait, Decreased activity tolerance  Visit Diagnosis: Pain in right knee  Stiffness of right knee, not elsewhere classified  Other abnormalities of gait and mobility  Difficulty in walking, not elsewhere classified     Problem List Patient Active Problem List   Diagnosis Date Noted  . Right knee injury 03/06/2015  . Left shoulder pain 01/11/2015  . Preventative health care 11/13/2014  . Lower back  injury 03/18/2012    JPercival Spanish PT, MPT 07/12/2015, 4:06 PM  [facility] [place] [address]Phone: [phone]  Fax:  [phone] Name: Karen HICKLEMRN: [id number] of Birth: 805-Aug-1977

## 2015-07-16 ENCOUNTER — Ambulatory Visit: Payer: PRIVATE HEALTH INSURANCE | Attending: Orthopedic Surgery

## 2015-07-16 DIAGNOSIS — M25561 Pain in right knee: Secondary | ICD-10-CM | POA: Diagnosis not present

## 2015-07-16 DIAGNOSIS — R262 Difficulty in walking, not elsewhere classified: Secondary | ICD-10-CM | POA: Insufficient documentation

## 2015-07-16 DIAGNOSIS — R2689 Other abnormalities of gait and mobility: Secondary | ICD-10-CM | POA: Diagnosis present

## 2015-07-16 DIAGNOSIS — M25661 Stiffness of right knee, not elsewhere classified: Secondary | ICD-10-CM | POA: Insufficient documentation

## 2015-07-16 NOTE — Therapy (Signed)
[facility] [address]  [place], [place], [address] Phone: [phone]   Fax:  [phone]  Physical Therapy Treatment  Patient Details  Name: [name] MRN: [id number] Date of Birth: 07/28/1975 Referring Provider: Kathalene Frames. Mayer Camel, MD  Encounter Date: 07/16/2015      PT End of Session - 07/16/15 0930    Visit Number 19   Number of Visits 30   Date for PT Re-Evaluation 08/09/15   Authorization Type Worker's comp   Authorization - Visit Number 9   Authorization - Number of Visits 20  8 additional visits approved (4 remaining of initial 12 visits authorized)   PT Start Time 0845   PT Stop Time 0940   PT Time Calculation (min) 55 min   Activity Tolerance Patient tolerated treatment well   Behavior During Therapy WFL for tasks assessed/performed      Past Medical History  Diagnosis Date  . H/O pyelonephritis 2001 & 2009  . Acute medial meniscus tear of right knee     Past Surgical History  Procedure Laterality Date  . Appendectomy  age 13  . Knee arthroscopy Right 06/08/2015    Procedure: RIGHT KNEE ARTHROSCOPY WITH DEBRIDEMENT;  Surgeon: Frederik Pear, MD;  Location: [place];  Service: Orthopedics;  Laterality: Right;    There were no vitals filed for this visit.      Subjective Assessment - 07/16/15 0849    Subjective Pt. reports no pain at the R knee currently, however reported soreness in B quads over the weekend after last treatment.     Patient Stated Goals "Get back to training for running"   Currently in Pain? No/denies   Pain Score 0-No pain   Multiple Pain Sites No      Today's Treatment  TherEx NuStep - lvl 5 x 4'  SL bridge x 10 each side  Cross over side stepping onto 8" x 10 reps each side  Alternating lunges onto BOSU (up) x 10 reps each way R lateral lunge onto BOSU ~ 70 dg x 10 BOSU (inverted) squat with 6# db held at ~90 dg shoulder flexion for upright  posture x10 R SLS rotational stabilization on blue foam oval with green TB x10 each med/lat B Side-stepping in partial squat with green TB x20 ft Monster walk Fwd/Back with green TB x20 ft Eccentric R lowering on 4" step  10 reps        PT Long Term Goals - 07/12/15 2637    PT LONG TERM GOAL #1   Title Independent with HEP/gym program by 08/09/15   Status On-going   PT LONG TERM GOAL #2   Title Pt will demonstrate right knee ROM 0-140 or greater without pain by 08/01/15   Status Achieved  07/12/15:  Pt. able to demo R knee AROM 0- 146  dg.     PT LONG TERM GOAL #3   Title Pt will demonstrate right LE strength 5-/5 or greater by 08/09/15   Status Partially Met  07/12/15: Pt. able to demo 5-/5 with R knee flexion strength, however R knee ext. 4/5 strength with pain on resistance.     PT LONG TERM GOAL #4   Title --   Status --   PT LONG TERM GOAL #5   Title Pt will ascend/descend stairs reciprocally with normal step pattern by 08/09/15   Status Partially Met  07/12/15: Pt. able to ascend stairs reciprocally with normal step  pattern w/o use of rail, however still demos hesistation with eccentric lowering on R and slight hip susbstitution.   PT LONG TERM GOAL #6   Title Pt will report performance of normal household chores and job tasks without limitation due to right knee by 08/09/15   Status On-going   PT LONG TERM GOAL #7   Title Pt will resume running at least 1 mile without increased R knee pain by 07/2515   Status On-going   PT LONG TERM GOAL #8   Title --               Plan - 07/16/15 0930    Clinical Impression Statement Pt. with improved functional squating and lunging technique today able to tolerated increased depth with lunging and squating before incidence of R knee pain.  Pt. reports being sore in B quads following previous treatment reporting the doorway lunges contributed to this.  Pt. progressing toward goals.     PT Treatment/Interventions Therapeutic  exercise;Manual techniques;Passive range of motion;Taping;Therapeutic activities;Balance training;Neuromuscular re-education;Gait training;Stair training;Cryotherapy;Vasopneumatic Device;Electrical Stimulation;Iontophoresis '4mg'$ /ml Dexamethasone;Patient/family education   PT Next Visit Plan Right knee ROM, Right LE strengthening with progression of standing CKC exercises, Manual therapy including taping PRN for ROM and edema management, Modailities PRN   Consulted and Agree with Plan of Care Patient      Patient will benefit from skilled therapeutic intervention in order to improve the following deficits and impairments:  Pain, Impaired flexibility, Increased edema, Decreased range of motion, Decreased strength, Difficulty walking, Abnormal gait, Decreased activity tolerance  Visit Diagnosis: Pain in right knee  Stiffness of right knee, not elsewhere classified  Other abnormalities of gait and mobility  Difficulty in walking, not elsewhere classified     Problem List Patient Active Problem List   Diagnosis Date Noted  . Right knee injury 03/06/2015  . Left shoulder pain 01/11/2015  . Preventative health care 11/13/2014  . Lower back injury 03/18/2012    [name], PTA 07/17/2015, 8:15 AM  [facility] [address]  [place], [place], [address] Phone: [phone]   Fax:  [phone]  Name: [name] MRN: [id number] Date of Birth: [birth year]

## 2015-07-19 ENCOUNTER — Ambulatory Visit: Payer: PRIVATE HEALTH INSURANCE | Admitting: Physical Therapy

## 2015-07-19 DIAGNOSIS — M25561 Pain in right knee: Secondary | ICD-10-CM | POA: Diagnosis not present

## 2015-07-19 DIAGNOSIS — R262 Difficulty in walking, not elsewhere classified: Secondary | ICD-10-CM

## 2015-07-19 DIAGNOSIS — R2689 Other abnormalities of gait and mobility: Secondary | ICD-10-CM

## 2015-07-19 DIAGNOSIS — M25661 Stiffness of right knee, not elsewhere classified: Secondary | ICD-10-CM

## 2015-07-19 NOTE — Therapy (Signed)
[facility] [address]  [place], [place], [address] Phone: [phone]   Fax:  [phone]  Physical Therapy Treatment  Patient Details  Name: [name] MRN: [id number] Date of Birth: 1975-10-11 Referring Provider: Kathalene Frames. Mayer Camel, MD  Encounter Date: 07/19/2015      PT End of Session - 07/19/15 0852    Visit Number 20   Number of Visits 30   Date for PT Re-Evaluation 08/09/15   Authorization Type Worker's comp   Authorization - Visit Number 10   Authorization - Number of Visits 20   PT Start Time 0848   PT Stop Time 0939   PT Time Calculation (min) 51 min   Activity Tolerance Patient tolerated treatment well   Behavior During Therapy WFL for tasks assessed/performed      Past Medical History  Diagnosis Date  . H/O pyelonephritis 2001 & 2009  . Acute medial meniscus tear of right knee     Past Surgical History  Procedure Laterality Date  . Appendectomy  age 38  . Knee arthroscopy Right 06/08/2015    Procedure: RIGHT KNEE ARTHROSCOPY WITH DEBRIDEMENT;  Surgeon: Frederik Pear, MD;  Location: [place];  Service: Orthopedics;  Laterality: Right;    There were no vitals filed for this visit.      Subjective Assessment - 07/19/15 0851    Subjective Pt w/o problems or concerns today.   Patient Stated Goals "Get back to training for running"   Currently in Pain? No/denies            [facility] PT Assessment - 07/19/15 0001    Observation/Other Assessments   Focus on Therapeutic Outcomes (FOTO)  Knee - 55% (45% limitation)         Today's Treatment  TherEx Rec Bike - Interval lvl 4-5 x5' B Alternating SL bridge x 15 each  Walking lunge 2x73f BATCA Knee flexion 35# x15 B concentric/R eccentric BATCA Knee extension 25# x10 B concentric/eccentric R Eccentric R lowering from 6" stepwith heel touch x10 Alternating fwd lunges onto BOSU (up) x 10 each Resisted gait all  directions x 1 lap (90 ft) each BOSU (inverted) squat with 7# db held at ~90 dg shoulder flexion for upright posture x10 R SLS on BOSU (inverted) 2x15" R SLS on BOSU (inverted) with small ball toss x30"  Vasopnuematic compression to R knee - lowest temp, med compression x10'          PT Long Term Goals - 07/19/15 0930    PT LONG TERM GOAL #1   Title Independent with HEP/gym program by 08/09/15   Status On-going   PT LONG TERM GOAL #2   Title Pt will demonstrate right knee ROM 0-140 or greater without pain by 08/01/15   Status Achieved  07/12/15:  Pt. able to demo R knee AROM 0- 146  dg.     PT LONG TERM GOAL #3   Title Pt will demonstrate right LE strength 5-/5 or greater by 08/09/15   Status Partially Met  07/12/15: Pt. able to demo 5-/5 with R knee flexion strength, however R knee ext. 4/5 strength with pain on resistance.     PT LONG TERM GOAL #5   Title Pt will ascend/descend stairs reciprocally with normal step pattern by 08/09/15   Status Partially Met  07/12/15: Pt. able to ascend stairs reciprocally with normal step pattern w/o use of rail, however still demos hesistation with eccentric lowering on  R and slight hip susbstitution.   PT LONG TERM GOAL #6   Title Pt will report performance of normal household chores and job tasks without limitation due to right knee by 08/09/15   Status On-going   PT LONG TERM GOAL #7   Title Pt will resume running at least 1 mile without increased R knee pain by 07/2515   Status On-going               Plan - 07/19/15 1749    Clinical Impression Statement Pt continues to demonstrate good progress with PT with no recent pain reported other than some muscle soreness after new exercises added during therapy session. Able to continue to tolerate progression of dynamic strengthening and stabilization exercises with fatigue noted but no pain.   PT Treatment/Interventions Therapeutic exercise;Manual techniques;Passive range of  motion;Taping;Therapeutic activities;Balance training;Neuromuscular re-education;Gait training;Stair training;Cryotherapy;Vasopneumatic Device;Electrical Stimulation;Iontophoresis 66m/ml Dexamethasone;Patient/family education   PT Next Visit Plan Right knee ROM, Right LE strengthening with progression of standing CKC exercises, Manual therapy including taping PRN for ROM and edema management, Modailities PRN   Consulted and Agree with Plan of Care Patient      Patient will benefit from skilled therapeutic intervention in order to improve the following deficits and impairments:  Pain, Impaired flexibility, Increased edema, Decreased range of motion, Decreased strength, Difficulty walking, Abnormal gait, Decreased activity tolerance  Visit Diagnosis: Pain in right knee  Stiffness of right knee, not elsewhere classified  Difficulty in walking, not elsewhere classified  Other abnormalities of gait and mobility     Problem List Patient Active Problem List   Diagnosis Date Noted  . Right knee injury 03/06/2015  . Left shoulder pain 01/11/2015  . Preventative health care 11/13/2014  . Lower back injury 03/18/2012    JPercival Spanish PT, MPT 07/19/2015, 5:56 PM  [facility] [place] [address]Phone: [phone]  Fax:  [phone] Name: Karen PASCALEMRN: [id number] of Birth: 812-29-1977

## 2015-07-23 ENCOUNTER — Ambulatory Visit: Payer: PRIVATE HEALTH INSURANCE

## 2015-07-23 DIAGNOSIS — M25661 Stiffness of right knee, not elsewhere classified: Secondary | ICD-10-CM

## 2015-07-23 DIAGNOSIS — M25561 Pain in right knee: Secondary | ICD-10-CM

## 2015-07-23 DIAGNOSIS — R262 Difficulty in walking, not elsewhere classified: Secondary | ICD-10-CM

## 2015-07-23 DIAGNOSIS — R2689 Other abnormalities of gait and mobility: Secondary | ICD-10-CM

## 2015-07-23 NOTE — Therapy (Signed)
[facility] [address]  [place], [place], [address] Phone: [phone]   Fax:  [phone]  Physical Therapy Treatment  Patient Details  Name: [name] MRN: [id number] Date of Birth: [birth year] Referring Provider: Kathalene Frames. Mayer Camel, MD  Encounter Date: 07/23/2015      PT End of Session - 07/23/15 0908    Visit Number 21   Number of Visits 30   Date for PT Re-Evaluation 08/09/15   Authorization Type Worker's comp   Authorization - Visit Number 11   Authorization - Number of Visits 20   PT Start Time [phone]   PT Stop Time 0930   PT Time Calculation (min) 41 min   Activity Tolerance Patient tolerated treatment well   Behavior During Therapy WFL for tasks assessed/performed      Past Medical History  Diagnosis Date  . H/O pyelonephritis 2001 & 2009  . Acute medial meniscus tear of right knee     Past Surgical History  Procedure Laterality Date  . Appendectomy  age 40  . Knee arthroscopy Right 06/08/2015    Procedure: RIGHT KNEE ARTHROSCOPY WITH DEBRIDEMENT;  Surgeon: Frederik Pear, MD;  Location: [place];  Service: Orthopedics;  Laterality: Right;    There were no vitals filed for this visit.      Subjective Assessment - 07/23/15 0853    Subjective Pt. reports only pressure in R knee occasionally with no pain initially.     Patient Stated Goals "Get back to training for running"   Currently in Pain? No/denies   Pain Score 0-No pain   Multiple Pain Sites No      Today's Treatment  TherEx NuStep: level 4, 5 min R PF, glute stretch x 30 sec  6" eccentric R quad lowering x 10 reps with 1 pole use  8" step up with black TB around knees x 10 reps BATCA Knee flexion 35# x15 B concentric/R eccentric BATCA Knee extension 25# x10 B concentric/eccentric BATCA knee extension 35# x 10 reps eccentric only; therapist assist with concentric  R Eccentric R lowering from 6" stepwith heel  touch x10 TRX squat x 15 reps Dorsiflexion rocker stretch x 30 sec  R SLS on BOSU (inverted) with small ball toss 3 x 30"  Stair navigation assessment:  Pt. able to ascend / descend x 12 stairs with reciprocal gait pattern, no rail use, and no hesitation with eccentric lowering       PT Long Term Goals - 07/23/15 0912    PT LONG TERM GOAL #1   Title Independent with HEP/gym program by 08/09/15   Status On-going   PT LONG TERM GOAL #2   Title Pt will demonstrate right knee ROM 0-140 or greater without pain by 08/01/15   Status Achieved  07/12/15:  Pt. able to demo R knee AROM 0- 146  dg.     PT LONG TERM GOAL #3   Title Pt will demonstrate right LE strength 5-/5 or greater by 08/09/15   Status Partially Met  07/12/15: Pt. able to demo 5-/5 with R knee flexion strength, however R knee ext. 4/5 strength with pain on resistance.     PT LONG TERM GOAL #4   Title Pt will ambulate with normal gait pattern without AD on all surfaces by 05/22/15   Status Achieved   PT LONG TERM GOAL #5   Title Pt will ascend/descend stairs reciprocally with normal step pattern by 08/09/15  Status Achieved  07/23/15: Pt. able to ascend stairs reciprocally with normal step pattern w/o use of rail with no hesitation.   PT LONG TERM GOAL #6   Title Pt will report performance of normal household chores and job tasks without limitation due to right knee by 08/09/15   Status Partially Met  07/23/15: Pt. only limted at work/home by low squating and descending stairs while carrying heavy objects.   PT LONG TERM GOAL #7   Title Pt will resume running at least 1 mile without increased R knee pain by 07/2515   Status On-going   PT LONG TERM GOAL #8   Title Pt will demonstrate right LE strength 5/5 without pain by 06/18/15   Status On-going               Plan - 07/23/15 0909    Clinical Impression Statement Pt. with only R knee stiffness today initially reporting she gave jogging a try for ~50 ft and, "didn't feel  right", over the weekend; pt. instructed to give the elliptical a try briefly and report back next PT visit.  Pt. tolerated all hip / knee strengthening activity well today with single leg stance ball catch on BOSU (down) advanced today; pt. performed very well with this.  Pt. able to ascend / descend stairs with no rail use symmetrically, without hip drop, and without hesitation.     PT Treatment/Interventions Therapeutic exercise;Manual techniques;Passive range of motion;Taping;Therapeutic activities;Balance training;Neuromuscular re-education;Gait training;Stair training;Cryotherapy;Vasopneumatic Device;Electrical Stimulation;Iontophoresis 37m/ml Dexamethasone;Patient/family education   PT Next Visit Plan Right knee ROM, Right LE strengthening with progression of standing CKC exercises, Manual therapy including taping PRN for ROM and edema management, Modailities PRN      Patient will benefit from skilled therapeutic intervention in order to improve the following deficits and impairments:  Pain, Impaired flexibility, Increased edema, Decreased range of motion, Decreased strength, Difficulty walking, Abnormal gait, Decreased activity tolerance  Visit Diagnosis: Pain in right knee  Stiffness of right knee, not elsewhere classified  Difficulty in walking, not elsewhere classified  Other abnormalities of gait and mobility     Problem List Patient Active Problem List   Diagnosis Date Noted  . Right knee injury 03/06/2015  . Left shoulder pain 01/11/2015  . Preventative health care 11/13/2014  . Lower back injury 03/18/2012    MBess Harvest PTA 07/23/2015, 11:41 AM  [facility] [place] [address]Phone: [phone]  Fax:  [phone] Name: Karen SALTZMANMRN: [id number] of Birth: [birth year]

## 2015-07-26 ENCOUNTER — Ambulatory Visit: Payer: PRIVATE HEALTH INSURANCE | Admitting: Physical Therapy

## 2015-07-26 DIAGNOSIS — M25561 Pain in right knee: Secondary | ICD-10-CM

## 2015-07-26 DIAGNOSIS — M25661 Stiffness of right knee, not elsewhere classified: Secondary | ICD-10-CM

## 2015-07-26 DIAGNOSIS — R2689 Other abnormalities of gait and mobility: Secondary | ICD-10-CM

## 2015-07-26 DIAGNOSIS — R262 Difficulty in walking, not elsewhere classified: Secondary | ICD-10-CM

## 2015-07-26 NOTE — Therapy (Signed)
[facility] [address]  [place], [place], [address] Phone: [phone]   Fax:  [phone]  Physical Therapy Treatment  Patient Details  Name: [name] MRN: [id number] Date of Birth: Apr 30, 1975 Referring Provider: Kathalene Frames. Mayer Camel, MD  Encounter Date: 07/26/2015      PT End of Session - 07/26/15 0853    Visit Number 22   Number of Visits 30   Date for PT Re-Evaluation 08/09/15   Authorization Type Worker's comp   Authorization - Visit Number 12   Authorization - Number of Visits 20   PT Start Time 0848   PT Stop Time 0928   PT Time Calculation (min) 40 min   Activity Tolerance Patient tolerated treatment well   Behavior During Therapy WFL for tasks assessed/performed      Past Medical History  Diagnosis Date  . H/O pyelonephritis 2001 & 2009  . Acute medial meniscus tear of right knee     Past Surgical History  Procedure Laterality Date  . Appendectomy  age 40  . Knee arthroscopy Right 06/08/2015    Procedure: RIGHT KNEE ARTHROSCOPY WITH DEBRIDEMENT;  Surgeon: Frederik Pear, MD;  Location: [place];  Service: Orthopedics;  Laterality: Right;    There were no vitals filed for this visit.      Subjective Assessment - 07/26/15 0852    Subjective Pt stating she has started working out again, using elliptical and performing TRX squats.   Currently in Pain? No/denies         Today's Treatment  TherEx Rec Bike - lvl 5 x 5' B SL bridge x15 each  Sustained Bridge + Alternating Hip ABD/ER with black TB x15 Fitter B Hip ABD & extension (1 black/1 blue) x15 each Walking lunge with trunk rotation holding yellow (2000 Gr) medicine ball 2x17f R SLS on BOSU (inverted) + Rebounder toss with red (1000 Gr) med ball - chest pass x15, overhead toss x15 Alternating fwd lunges onto BOSU (up) x15 each TRX squat 2x10 Monster walk Fwd/Back with blue TB x20 ft B Side-stepping in partial  squat with blue TB x20 ft          PT Long Term Goals - 07/23/15 0912    PT LONG TERM GOAL #1   Title Independent with HEP/gym program by 08/09/15   Status On-going   PT LONG TERM GOAL #2   Title Pt will demonstrate right knee ROM 0-140 or greater without pain by 08/01/15   Status Achieved  07/12/15:  Pt. able to demo R knee AROM 0- 146  dg.     PT LONG TERM GOAL #3   Title Pt will demonstrate right LE strength 5-/5 or greater by 08/09/15   Status Partially Met  07/12/15: Pt. able to demo 5-/5 with R knee flexion strength, however R knee ext. 4/5 strength with pain on resistance.     PT LONG TERM GOAL #4   Title Pt will ambulate with normal gait pattern without AD on all surfaces by 05/22/15   Status Achieved   PT LONG TERM GOAL #5   Title Pt will ascend/descend stairs reciprocally with normal step pattern by 08/09/15   Status Achieved  07/23/15: Pt. able to ascend stairs reciprocally with normal step pattern w/o use of rail with no hesitation.   PT LONG TERM GOAL #6   Title Pt will report performance of normal household chores and job tasks without limitation due to right  knee by 08/09/15   Status Partially Met  07/23/15: Pt. only limted at work/home by low squating and descending stairs while carrying heavy objects.   PT LONG TERM GOAL #7   Title Pt will resume running at least 1 mile without increased R knee pain by 07/2515   Status On-going   PT LONG TERM GOAL #8   Title Pt will demonstrate right LE strength 5/5 without pain by 06/18/15   Status On-going               Plan - 07/26/15 0905    Clinical Impression Statement Pt reporting no issues with initial return to gym work-out with ellipitical and TRX squat. Continues to tolerate progression of strengthening and stability exercises without pain other than slight discomfort noted with walking lunges when rotation added. Overall pt progressing well with PT POC.   PT Treatment/Interventions Therapeutic exercise;Manual  techniques;Passive range of motion;Taping;Therapeutic activities;Balance training;Neuromuscular re-education;Gait training;Stair training;Cryotherapy;Vasopneumatic Device;Electrical Stimulation;Iontophoresis '4mg'$ /ml Dexamethasone;Patient/family education   PT Next Visit Plan Right knee ROM, Right LE strengthening with progression of standing CKC exercises, Manual therapy including taping PRN for ROM and edema management, Modailities PRN   Consulted and Agree with Plan of Care Patient      Patient will benefit from skilled therapeutic intervention in order to improve the following deficits and impairments:  Pain, Impaired flexibility, Increased edema, Decreased range of motion, Decreased strength, Difficulty walking, Abnormal gait, Decreased activity tolerance  Visit Diagnosis: Pain in right knee  Stiffness of right knee, not elsewhere classified  Difficulty in walking, not elsewhere classified  Other abnormalities of gait and mobility     Problem List Patient Active Problem List   Diagnosis Date Noted  . Right knee injury 03/06/2015  . Left shoulder pain 01/11/2015  . Preventative health care 11/13/2014  . Lower back injury 03/18/2012    Percival Spanish, PT, MPT 07/26/2015, 9:28 AM  [facility] [address]  [address] [place], [place], [address] Phone: [phone]   Fax:  [phone]  Name: [name] MRN: [id number] Date of Birth: 1975/08/07

## 2015-07-30 ENCOUNTER — Ambulatory Visit: Payer: PRIVATE HEALTH INSURANCE

## 2015-07-31 ENCOUNTER — Ambulatory Visit: Payer: PRIVATE HEALTH INSURANCE

## 2015-07-31 DIAGNOSIS — M25561 Pain in right knee: Secondary | ICD-10-CM | POA: Diagnosis not present

## 2015-07-31 DIAGNOSIS — M25661 Stiffness of right knee, not elsewhere classified: Secondary | ICD-10-CM

## 2015-07-31 DIAGNOSIS — R2689 Other abnormalities of gait and mobility: Secondary | ICD-10-CM

## 2015-07-31 DIAGNOSIS — R262 Difficulty in walking, not elsewhere classified: Secondary | ICD-10-CM

## 2015-07-31 NOTE — Therapy (Signed)
[facility] [address]  [place], [place], [address] Phone: [phone]   Fax:  [phone]  Physical Therapy Treatment  Patient Details  Name: [name] MRN: [id number] Date of Birth: [birth year] Referring Provider: Kathalene Frames. Mayer Camel, MD  Encounter Date: 07/31/2015      PT End of Session - 07/31/15 1231    Visit Number 23   Number of Visits 30   Date for PT Re-Evaluation 08/09/15   Authorization Type Worker's comp   Authorization - Visit Number 33   Authorization - Number of Visits 20   PT Start Time 0930   PT Stop Time 1015   PT Time Calculation (min) 45 min   Activity Tolerance Patient tolerated treatment well   Behavior During Therapy WFL for tasks assessed/performed      Past Medical History  Diagnosis Date  . H/O pyelonephritis 2001 & 2009  . Acute medial meniscus tear of right knee     Past Surgical History  Procedure Laterality Date  . Appendectomy  age 5  . Knee arthroscopy Right 06/08/2015    Procedure: RIGHT KNEE ARTHROSCOPY WITH DEBRIDEMENT;  Surgeon: Frederik Pear, MD;  Location: [place];  Service: Orthopedics;  Laterality: Right;    There were no vitals filed for this visit.      Subjective Assessment - 07/31/15 1229    Subjective Pt. pain free today initially and reports she has continued to use elliptical at gym and feels good with this.  Pt. reported she was mildly sore in R knee and surrounding muscles the next morning following last treatment however that this soreness resolved over the weekend and that she feels much better today.   Patient Stated Goals "Get back to training for running"   Currently in Pain? No/denies   Pain Score 0-No pain   Multiple Pain Sites No    Today's Treatment:   Therex:  Treadmill: 3.7 mph, 4 min  TRX squat (deep ~ 110 dg) x 10 reps Alternating lunge walk with yellow Resisted gait all direction x 1 lap Side stepping in partial  squat x 20 ft down / back with green TB  Monster walk in partial squat x 20 ft down / back with green TB BATCA leg press 35# x 15 reps BATCA leg press calf raise x 15 reps  Single leg calf raise on UBE x 15 each side R SLS on BOSU (inverted) + Rebounder toss with red (1000 Gr) med ball - chest pass x15, overhead toss x15 Bridge + hip isometric / ER with black TB x 10 reps each side  Bridge + HS curl combo with heels on peanut p-ball x 15 reps       PT Long Term Goals - 07/23/15 0912    PT LONG TERM GOAL #1   Title Independent with HEP/gym program by 08/09/15   Status On-going   PT LONG TERM GOAL #2   Title Pt will demonstrate right knee ROM 0-140 or greater without pain by 08/01/15   Status Achieved  07/12/15:  Pt. able to demo R knee AROM 0- 146  dg.     PT LONG TERM GOAL #3   Title Pt will demonstrate right LE strength 5-/5 or greater by 08/09/15   Status Partially Met  07/12/15: Pt. able to demo 5-/5 with R knee flexion strength, however R knee ext. 4/5 strength with pain on resistance.     PT LONG TERM GOAL #  4   Title Pt will ambulate with normal gait pattern without AD on all surfaces by 05/22/15   Status Achieved   PT LONG TERM GOAL #5   Title Pt will ascend/descend stairs reciprocally with normal step pattern by 08/09/15   Status Achieved  07/23/15: Pt. able to ascend stairs reciprocally with normal step pattern w/o use of rail with no hesitation.   PT LONG TERM GOAL #6   Title Pt will report performance of normal household chores and job tasks without limitation due to right knee by 08/09/15   Status Partially Met  07/23/15: Pt. only limted at work/home by low squating and descending stairs while carrying heavy objects.   PT LONG TERM GOAL #7   Title Pt will resume running at least 1 mile without increased R knee pain by 07/2515   Status On-going   PT LONG TERM GOAL #8   Title Pt will demonstrate right LE strength 5/5 without pain by 06/18/15   Status On-going                Plan - 07/31/15 1231    Clinical Impression Statement Pt. pain free today initially and reports she has continued to use elliptical at gym and feels good with this.  Pt. reported she was mildly sore in R knee and surrounding muscles the next morning following last treatment however that this soreness resolved over the weekend and that she feels much better today.  Advanced lunging and squating activities continued today with pt. tolerating all well with only occasional mild discomfort reporting at R knee.  Pt. progressing very well and gaining confidence with high level single leg balance activities.     PT Treatment/Interventions Therapeutic exercise;Manual techniques;Passive range of motion;Taping;Therapeutic activities;Balance training;Neuromuscular re-education;Gait training;Stair training;Cryotherapy;Vasopneumatic Device;Electrical Stimulation;Iontophoresis '4mg'$ /ml Dexamethasone;Patient/family education   PT Next Visit Plan Right knee ROM, Right LE strengthening with progression of standing CKC exercises, Manual therapy including taping PRN for ROM and edema management, Modailities PRN      Patient will benefit from skilled therapeutic intervention in order to improve the following deficits and impairments:  Pain, Impaired flexibility, Increased edema, Decreased range of motion, Decreased strength, Difficulty walking, Abnormal gait, Decreased activity tolerance  Visit Diagnosis: Pain in right knee  Stiffness of right knee, not elsewhere classified  Difficulty in walking, not elsewhere classified  Other abnormalities of gait and mobility     Problem List Patient Active Problem List   Diagnosis Date Noted  . Right knee injury 03/06/2015  . Left shoulder pain 01/11/2015  . Preventative health care 11/13/2014  . Lower back injury 03/18/2012    [name], PTA 07/31/2015, 12:38 PM  [facility] [address]  [place], [place], [address] Phone: [phone]   Fax:  [phone]  Name: [name] MRN: [id number] Date of Birth: 1975-06-04

## 2015-08-02 ENCOUNTER — Ambulatory Visit: Payer: PRIVATE HEALTH INSURANCE

## 2015-08-02 DIAGNOSIS — R262 Difficulty in walking, not elsewhere classified: Secondary | ICD-10-CM

## 2015-08-02 DIAGNOSIS — M25561 Pain in right knee: Secondary | ICD-10-CM

## 2015-08-02 DIAGNOSIS — R2689 Other abnormalities of gait and mobility: Secondary | ICD-10-CM

## 2015-08-02 DIAGNOSIS — M25661 Stiffness of right knee, not elsewhere classified: Secondary | ICD-10-CM

## 2015-08-02 NOTE — Therapy (Signed)
[facility] [address]  [place], [place], [address] Phone: [phone]   Fax:  [phone]  Physical Therapy Treatment  Patient Details  Name: [name] MRN: [id number] Date of Birth: 1975/12/01 Referring Provider: Kathalene Frames. Mayer Camel, MD  Encounter Date: 08/02/2015      PT End of Session - 08/02/15 1107    Visit Number 24   Number of Visits 30   Date for PT Re-Evaluation 08/09/15   Authorization Type Worker's comp   Authorization - Visit Number 87   Authorization - Number of Visits 20   PT Start Time 6945   PT Stop Time 1145   PT Time Calculation (min) 40 min   Activity Tolerance Patient tolerated treatment well   Behavior During Therapy WFL for tasks assessed/performed      Past Medical History  Diagnosis Date  . H/O pyelonephritis 2001 & 2009  . Acute medial meniscus tear of right knee     Past Surgical History  Procedure Laterality Date  . Appendectomy  age 89  . Knee arthroscopy Right 06/08/2015    Procedure: RIGHT KNEE ARTHROSCOPY WITH DEBRIDEMENT;  Surgeon: Frederik Pear, MD;  Location: [place];  Service: Orthopedics;  Laterality: Right;    There were no vitals filed for this visit.      Subjective Assessment - 08/02/15 1148    Subjective Pt. pain free initially today and reports starting to use rowing machine at home with no issues.     Patient Stated Goals "Get back to training for running"   Currently in Pain? No/denies   Pain Score 0-No pain   Multiple Pain Sites No     Today's Treatment:  Therex: Treadmill: 3.8 mph, 4 min SL Bridge with SLR x 15 reps each side  B  SL stance on blue oval foam with red TB press x 10 reps B SL stance BATCA cable row back 15# x 10 reps each  BATCA HS curl machine 45# x 12 B  BATCA HS curl machine 35# x 10; B on concentric, R on eccentric  BATCA press 45# x 15 reps R leg dorsiflexion rocker stretch x 30 sec  BOSU ball alternating  (long step) lunges x 10 reps each side  8" cross-over step up 3# diumbbell  x 10 reps each leg  Single leg calf raise on UBE x 15 reps B dorsiflexion rocker stretch x 30 sec each         PT Long Term Goals - 07/23/15 0912    PT LONG TERM GOAL #1   Title Independent with HEP/gym program by 08/09/15   Status On-going   PT LONG TERM GOAL #2   Title Pt will demonstrate right knee ROM 0-140 or greater without pain by 08/01/15   Status Achieved  07/12/15:  Pt. able to demo R knee AROM 0- 146  dg.     PT LONG TERM GOAL #3   Title Pt will demonstrate right LE strength 5-/5 or greater by 08/09/15   Status Partially Met  07/12/15: Pt. able to demo 5-/5 with R knee flexion strength, however R knee ext. 4/5 strength with pain on resistance.     PT LONG TERM GOAL #4   Title Pt will ambulate with normal gait pattern without AD on all surfaces by 05/22/15   Status Achieved   PT LONG TERM GOAL #5   Title Pt will ascend/descend stairs reciprocally with normal step pattern by  08/09/15   Status Achieved  07/23/15: Pt. able to ascend stairs reciprocally with normal step pattern w/o use of rail with no hesitation.   PT LONG TERM GOAL #6   Title Pt will report performance of normal household chores and job tasks without limitation due to right knee by 08/09/15   Status Partially Met  07/23/15: Pt. only limted at work/home by low squating and descending stairs while carrying heavy objects.   PT LONG TERM GOAL #7   Title Pt will resume running at least 1 mile without increased R knee pain by 07/2515   Status On-going   PT LONG TERM GOAL #8   Title Pt will demonstrate right LE strength 5/5 without pain by 06/18/15   Status On-going               Plan - 08/02/15 1108    Clinical Impression Statement Pt. continues to be pain free at R knee and perform very well with high level lunging, squating, and single leg balance activities.  Pt. reports using rowing machine at home the last few days with no issue. Pt.  with Md visit on 5/24; PT to assess pt. status toward goals on 5/22.     PT Treatment/Interventions Therapeutic exercise;Manual techniques;Passive range of motion;Taping;Therapeutic activities;Balance training;Neuromuscular re-education;Gait training;Stair training;Cryotherapy;Vasopneumatic Device;Electrical Stimulation;Iontophoresis 28m/ml Dexamethasone;Patient/family education   PT Next Visit Plan PT assessment of goals for pt. appointment with MD on 5/24; Right knee ROM, Right LE strengthening with progression of standing CKC exercises, Manual therapy including taping PRN for ROM and edema management, Modailities PRN      Patient will benefit from skilled therapeutic intervention in order to improve the following deficits and impairments:  Pain, Impaired flexibility, Increased edema, Decreased range of motion, Decreased strength, Difficulty walking, Abnormal gait, Decreased activity tolerance  Visit Diagnosis: Pain in right knee  Stiffness of right knee, not elsewhere classified  Difficulty in walking, not elsewhere classified  Other abnormalities of gait and mobility     Problem List Patient Active Problem List   Diagnosis Date Noted  . Right knee injury 03/06/2015  . Left shoulder pain 01/11/2015  . Preventative health care 11/13/2014  . Lower back injury 03/18/2012    MBess Harvest PTA 08/02/2015, 11:52 AM  [facility] [place] [address]Phone: [phone]  Fax:  [phone] Name: Karen RINGGENBERGMRN: [id number] of Birth: [birth year]

## 2015-08-06 ENCOUNTER — Ambulatory Visit: Payer: PRIVATE HEALTH INSURANCE | Admitting: Physical Therapy

## 2015-08-06 DIAGNOSIS — M25561 Pain in right knee: Secondary | ICD-10-CM | POA: Diagnosis not present

## 2015-08-06 DIAGNOSIS — R2689 Other abnormalities of gait and mobility: Secondary | ICD-10-CM

## 2015-08-06 DIAGNOSIS — M25661 Stiffness of right knee, not elsewhere classified: Secondary | ICD-10-CM

## 2015-08-06 DIAGNOSIS — R262 Difficulty in walking, not elsewhere classified: Secondary | ICD-10-CM

## 2015-08-06 NOTE — Therapy (Signed)
[facility] [address]  [address] [place], [place], [address] Phone: [phone]   Fax:  [phone]  Physical Therapy Treatment  Patient Details  Name: [name] MRN: [id number] Date of Birth: 12/27/1975 Referring Provider: Feliberto Gottron. Turner Daniels, MD  Encounter Date: 08/06/2015      PT End of Session - 08/06/15 0855    Visit Number 26   Number of Visits 30   Date for PT Re-Evaluation 08/09/15   Authorization Type Worker's comp   Authorization - Visit Number 15   Authorization - Number of Visits 20   PT Start Time [phone]   PT Stop Time 0930   PT Time Calculation (min) 43 min   Activity Tolerance Patient tolerated treatment well   Behavior During Therapy WFL for tasks assessed/performed      Past Medical History  Diagnosis Date  . H/O pyelonephritis 2001 & 2009  . Acute medial meniscus tear of right knee     Past Surgical History  Procedure Laterality Date  . Appendectomy  age 12  . Knee arthroscopy Right 06/08/2015    Procedure: RIGHT KNEE ARTHROSCOPY WITH DEBRIDEMENT;  Surgeon: Gean Birchwood, MD;  Location: [facility];  Service: Orthopedics;  Laterality: Right;    There were no vitals filed for this visit.      Subjective Assessment - 08/06/15 0853    Subjective Pt reports able to walk 2 miles on treadmill in 32 minutes on Friday and walk on hilly terrain in neighborhood over the weekend with only slight discomfort from swelling noted afterward, relieved with Ibuprofen.   Currently in Pain? No/denies            [facility] PT Assessment - 08/06/15 0847    Assessment   Medical Diagnosis R patella fracture s/p knee scope 06/08/15   Referring Provider Feliberto Gottron. Turner Daniels, MD   Onset Date/Surgical Date 06/08/15  R knee scope; initial injury 03/05/2015   Next MD Visit 08/08/15   ROM / Strength   AROM / PROM / Strength AROM;Strength   AROM   AROM Assessment Site Knee   Right/Left Knee Right   Right Knee  Extension -2   Right Knee Flexion 144   Strength   Strength Assessment Site Hip;Knee   Right/Left Hip Right   Right Hip Flexion 5/5   Right Hip Extension 4+/5   Right Hip ABduction --  5-/5   Right Hip ADduction 4+/5   Right/Left Knee Right   Right Knee Flexion 5/5   Right Knee Extension 4+/5           Today's Treatment  ROM/MMT check Goal assessment  TherEx Treadmill: 4.0 mph x 5' B sidelying bridge 10x3" Sustained bridge with heels on orange (55 cm) Pball + alternating SLR x10 R SL lunge with L foot suspended in TRX x10 R SLS on BOSU (inverted) with vector reach (fwd/side/back) x10 each B Small lunge jump x5 each R leg dorsiflexion rocker stretch x30"          PT Long Term Goals - 08/06/15 0904    PT LONG TERM GOAL #1   Title Independent with HEP/gym program by 08/09/15   Status On-going   PT LONG TERM GOAL #2   Title Pt will demonstrate right knee ROM 0-140 or greater without pain by 08/01/15   Status Achieved  R knee AROM:  -2-144   PT LONG TERM GOAL #3   Title Pt will demonstrate right LE strength  5-/5 or greater by 08/09/15   Status Partially Met  R hip & knee flexion 5/5, Hip abduction 5-/5, Hip extension, adduction & knee extension 4+/5   PT LONG TERM GOAL #4   Title Pt will ambulate with normal gait pattern without AD on all surfaces by 05/22/15   Status Achieved   PT LONG TERM GOAL #5   Title Pt will ascend/descend stairs reciprocally with normal step pattern by 08/09/15   Status Achieved  Pt. able to ascend stairs reciprocally with normal step pattern w/o use of rail with no hesitation.   PT LONG TERM GOAL #6   Title Pt will report performance of normal household chores and job tasks without limitation due to right knee by 08/09/15   Status Partially Met  Pt. only limted at work/home by low squating (to get in to low cabinets at home/work or med fridge at work) and descending stairs while carrying heavy objects.   PT LONG TERM GOAL #7   Title Pt  will resume running at least 1 mile without increased R knee pain by 07/2515   Status On-going  Not attempted due to awaiting MD clearance   PT LONG TERM GOAL #8   Title --   Status --               Plan - 08/06/15 1300    Clinical Impression Statement Pt continues to demosntrate excellent progress with PT, with restoration of full AROM in R knee and imporving R LE strength  with R hip & knee flexion 5/5, Hip abduction 5-/5, Hip extension, adduction & knee extension 4+/5. Pt tolerating increasing time/distance with walking both on treatmill and uneven surfaces in neighborhood with only slight discomfort noted due to slight swelling after activity, but easily managed with Ibuprofen. Pt has yet to attempt to resume running due to awaiting clearance from MD. Pt tolerating progressively challenging strenghtening and stability activities without c/o pain. Pt will benefit from continued therapy to maximize strength and ensure good stabilty as she returns to running.   PT Treatment/Interventions Therapeutic exercise;Manual techniques;Passive range of motion;Taping;Therapeutic activities;Balance training;Neuromuscular re-education;Gait training;Stair training;Cryotherapy;Vasopneumatic Device;Electrical Stimulation;Iontophoresis '4mg'$ /ml Dexamethasone;Patient/family education   PT Next Visit Plan Right LE strengthening with progression of standing CKC & stability exercises, Manual therapy including taping PRN for ROM and edema management, Modailities PRN   Consulted and Agree with Plan of Care Patient      Patient will benefit from skilled therapeutic intervention in order to improve the following deficits and impairments:  Pain, Impaired flexibility, Increased edema, Decreased range of motion, Decreased strength, Difficulty walking, Abnormal gait, Decreased activity tolerance  Visit Diagnosis: Pain in right knee  Stiffness of right knee, not elsewhere classified  Difficulty in walking, not  elsewhere classified  Other abnormalities of gait and mobility     Problem List Patient Active Problem List   Diagnosis Date Noted  . Right knee injury 03/06/2015  . Left shoulder pain 01/11/2015  . Preventative health care 11/13/2014  . Lower back injury 03/18/2012    Percival Spanish, PT, MPT 08/06/2015, 1:14 PM  [facility] [address]  [address] [place], [place], [address] Phone: [phone]   Fax:  [phone]  Name: [name] MRN: [id number] Date of Birth: Feb 03, 1976

## 2015-08-14 ENCOUNTER — Ambulatory Visit: Payer: PRIVATE HEALTH INSURANCE

## 2015-08-14 DIAGNOSIS — R262 Difficulty in walking, not elsewhere classified: Secondary | ICD-10-CM

## 2015-08-14 DIAGNOSIS — M25561 Pain in right knee: Secondary | ICD-10-CM

## 2015-08-14 DIAGNOSIS — R2689 Other abnormalities of gait and mobility: Secondary | ICD-10-CM

## 2015-08-14 DIAGNOSIS — M25661 Stiffness of right knee, not elsewhere classified: Secondary | ICD-10-CM

## 2015-08-14 NOTE — Therapy (Signed)
[facility] [address]  [place], [place], [address] Phone: [phone]   Fax:  [phone]  Physical Therapy Treatment  Patient Details  Name: [name] MRN: [id number] Date of Birth: 08-Mar-1976 Referring Provider: Kathalene Frames. Mayer Camel, MD  Encounter Date: 08/14/2015      PT End of Session - 08/14/15 0809    Visit Number 27   Number of Visits 30   Date for PT Re-Evaluation 08/09/15   Authorization Type Worker's comp   Authorization - Visit Number 32   Authorization - Number of Visits 20   PT Start Time 0800   PT Stop Time 0841   PT Time Calculation (min) 41 min   Activity Tolerance Patient tolerated treatment well   Behavior During Therapy WFL for tasks assessed/performed      Past Medical History  Diagnosis Date  . H/O pyelonephritis 2001 & 2009  . Acute medial meniscus tear of right knee     Past Surgical History  Procedure Laterality Date  . Appendectomy  age 64  . Knee arthroscopy Right 06/08/2015    Procedure: RIGHT KNEE ARTHROSCOPY WITH DEBRIDEMENT;  Surgeon: Frederik Pear, MD;  Location: [place];  Service: Orthopedics;  Laterality: Right;    There were no vitals filed for this visit.      Subjective Assessment - 08/14/15 0803    Subjective Pt. reports she is currently cleared by the MD for running and anything the pt. wants to do.     Patient Stated Goals "Get back to training for running"   Currently in Pain? No/denies   Pain Score 0-No pain      Today's Treatment:  Therex:  Elliptical: level 4.0, 5 min Functional Squating with 6# dumbbell x 15 reps Single leg bridge with SLR with black TB around knees x 15 reps each side  Jump squat x 5 reps with focus on soft landing  Hooklying B hip abd/ER with black TB  B Fitter hip abduction (1 black / 1 blue) x 15 reps  Side <> side plyometric single leg hop x 10 reps each side  Side stepping with blue TB 2 x 20 ft   Monster walk with blue TB 2 x 20 ft  Alternating lunge onto BOSU ball (up) x 10 reps each  Eccentric lowering on 4" step x 10 reps; 1 pole support  Dorsiflexion rocker stretch x 1 min BATCA HS curl machine 45# x 15 reps  BATCA extension machine 35# just eccentric, therapist concentric x 10 reps 3 sec down B RF stretch (in prone thomas position) x 30 sec each          PT Long Term Goals - 08/14/15 1249    PT LONG TERM GOAL #1   Title Independent with HEP/gym program by 08/09/15   Status Partially Met  08/14/15: pt. independent with current HEP program at this point and gym program will continued to be modified per pt. tolerance.    PT LONG TERM GOAL #2   Title Pt will demonstrate right knee ROM 0-140 or greater without pain by 08/01/15   Status Achieved  R knee AROM:  -2-144   PT LONG TERM GOAL #3   Title Pt will demonstrate right LE strength 5-/5 or greater by 08/09/15   Status Partially Met  R hip & knee flexion 5/5, Hip abduction 5-/5, Hip extension, adduction & knee extension 4+/5   PT LONG TERM GOAL #4  Title Pt will ambulate with normal gait pattern without AD on all surfaces by 05/22/15   Status Achieved   PT LONG TERM GOAL #5   Title Pt will ascend/descend stairs reciprocally with normal step pattern by 08/09/15   Status Achieved  Pt. able to ascend stairs reciprocally with normal step pattern w/o use of rail with no hesitation.   PT LONG TERM GOAL #6   Title Pt will report performance of normal household chores and job tasks without limitation due to right knee by 08/09/15   Status Partially Met  Pt. only limted at work/home by low squating (to get in to low cabinets at home/work or med fridge at work) and descending stairs while carrying heavy objects.   PT LONG TERM GOAL #7   Title Pt will resume running at least 1 mile without increased R knee pain by 07/2515   Status Achieved  08/14/15: pt. reports she is able to run 1 mile without increased R knee pain, however was sore  in knee the next day.                 Plan - 08/14/15 0810    Clinical Impression Statement Pt. reports she is currently cleared by the MD for running and anything the pt. wants to do at this point.  Today's treatment continued with functional squating, lunging and focused on light introduction into double and single leg plyometric activitiy; pt. reports she has ran distance twice since MD appointment and the R knee has felt good.     PT Treatment/Interventions Therapeutic exercise;Manual techniques;Passive range of motion;Taping;Therapeutic activities;Balance training;Neuromuscular re-education;Gait training;Stair training;Cryotherapy;Vasopneumatic Device;Electrical Stimulation;Iontophoresis '4mg'$ /ml Dexamethasone;Patient/family education   PT Next Visit Plan Light introduction to single leg plyometrics; Right LE strengthening with progression of standing CKC & stability exercises, Manual therapy including taping PRN for ROM and edema management, Modailities PRN      Patient will benefit from skilled therapeutic intervention in order to improve the following deficits and impairments:  Pain, Impaired flexibility, Increased edema, Decreased range of motion, Decreased strength, Difficulty walking, Abnormal gait, Decreased activity tolerance  Visit Diagnosis: Pain in right knee  Stiffness of right knee, not elsewhere classified  Difficulty in walking, not elsewhere classified  Other abnormalities of gait and mobility     Problem List Patient Active Problem List   Diagnosis Date Noted  . Right knee injury 03/06/2015  . Left shoulder pain 01/11/2015  . Preventative health care 11/13/2014  . Lower back injury 03/18/2012    [name], PTA 08/14/2015, 12:53 PM  [facility] [address]  [place], [place], [address] Phone: [phone]   Fax:  [phone]  Name: [name] MRN: [id number] Date of Birth:  1975/10/30

## 2015-08-15 ENCOUNTER — Ambulatory Visit: Payer: PRIVATE HEALTH INSURANCE

## 2015-08-15 DIAGNOSIS — R262 Difficulty in walking, not elsewhere classified: Secondary | ICD-10-CM

## 2015-08-15 DIAGNOSIS — R2689 Other abnormalities of gait and mobility: Secondary | ICD-10-CM

## 2015-08-15 DIAGNOSIS — M25661 Stiffness of right knee, not elsewhere classified: Secondary | ICD-10-CM

## 2015-08-15 DIAGNOSIS — M25561 Pain in right knee: Secondary | ICD-10-CM | POA: Diagnosis not present

## 2015-08-15 NOTE — Therapy (Signed)
[facility] [address]  [place], [place], [address] Phone: [phone]   Fax:  [phone]  Physical Therapy Treatment  Patient Details  Name: [name] MRN: [id number] Date of Birth: [birth year] Referring Provider: Kathalene Frames. Mayer Camel, MD  Encounter Date: 08/15/2015      PT End of Session - 08/15/15 0927    Visit Number 28   Number of Visits 30   Date for PT Re-Evaluation 08/09/15   Authorization Type Worker's comp   Authorization - Visit Number 80   Authorization - Number of Visits 20   PT Start Time 0845   PT Stop Time 0930   PT Time Calculation (min) 45 min   Activity Tolerance Patient tolerated treatment well   Behavior During Therapy WFL for tasks assessed/performed      Past Medical History  Diagnosis Date  . H/O pyelonephritis 2001 & 2009  . Acute medial meniscus tear of right knee     Past Surgical History  Procedure Laterality Date  . Appendectomy  age 89  . Knee arthroscopy Right 06/08/2015    Procedure: RIGHT KNEE ARTHROSCOPY WITH DEBRIDEMENT;  Surgeon: Frederik Pear, MD;  Location: [place];  Service: Orthopedics;  Laterality: Right;    There were no vitals filed for this visit.      Subjective Assessment - 08/15/15 1620    Subjective Pt. reports she is not very sore at all from yesterday's treatment and feeling pretty good pain free.     Patient Stated Goals "Get back to training for running"   Currently in Pain? No/denies   Pain Score 0-No pain   Multiple Pain Sites No      Today's Treatment:  Therex: Treadmill: 5.0 level, 4 min / 2.0 level, 1 min  HS + bridge combo with heels on peanut p-ball x 15 reps SL bridge x 10 reps each side  Bridge with hip isometric / ER with black TB x 10 reps each side Side stepping with blue TB 2 x 20 ft  Monster walk with blue TB 2 x 20 ft  TRX deep squatting x 15 reps  Single leg squatting with staggered stance and back  foot toe-touch x 10 reps each side; focusing on staying in squat entire set R RF stretch (in prone thomas position) x 30 sec each   Side <> side skate on fitter 2 x 15 reps; with CGA on 2 pole   Eccentric lowering on 4" step x 15 reps; 1 pole support  Dorsiflexion rocker stretch x 1 min BATCA HS curl machine 45# x 15 reps  BATCA extension machine 35# just eccentric, therapist concentric x 10 reps 3 sec down B single leg calf raise on UBE 2 x 15 reps       PT Long Term Goals - 08/15/15 0902    PT LONG TERM GOAL #1   Title Independent with HEP/gym program by 08/09/15   Status Partially Met  08/14/15: pt. independent with current HEP program at this point and gym program will continued to be modified per pt. tolerance.    PT LONG TERM GOAL #2   Title Pt will demonstrate right knee ROM 0-140 or greater without pain by 08/01/15   Status Achieved  R knee AROM:  -2-144   PT LONG TERM GOAL #3   Title Pt will demonstrate right LE strength 5-/5 or greater by 08/09/15   Status Partially Met  R hip & knee  flexion 5/5, Hip abduction 5-/5, Hip extension, adduction & knee extension 4+/5   PT LONG TERM GOAL #4   Title Pt will ambulate with normal gait pattern without AD on all surfaces by 05/22/15   Status Achieved   PT LONG TERM GOAL #5   Title Pt will ascend/descend stairs reciprocally with normal step pattern by 08/09/15   Status Achieved  Pt. able to ascend stairs reciprocally with normal step pattern w/o use of rail with no hesitation.   PT LONG TERM GOAL #6   Title Pt will report performance of normal household chores and job tasks without limitation due to right knee by 08/09/15   Status Partially Met  08/15/15: Pt. only limted at work/home by low squating (to get in to low cabinets at home/work or med fridge at work) and descending stairs while carrying heavy objects (children).   PT LONG TERM GOAL #7   Title Pt will resume running at least 1 mile without increased R knee pain by 07/2515    Status Achieved  08/15/15: pt. reports she is able to run 2.5 miles with 15 min running, 15 walking with only mild R knee soreness.                Plan - 08/15/15 1621    Clinical Impression Statement Pt. reports she is mildly sore from yesterday's treatment however feeling pretty good and pain free at the R knee.  Today's treatment focused on functional squating and lunging activities with pt. tolerating all very well; plyometric activities defered today due to yesterday's focus on this.  Pt. progressing very well at this point.     PT Treatment/Interventions Therapeutic exercise;Manual techniques;Passive range of motion;Taping;Therapeutic activities;Balance training;Neuromuscular re-education;Gait training;Stair training;Cryotherapy;Vasopneumatic Device;Electrical Stimulation;Iontophoresis 76m/ml Dexamethasone;Patient/family education   PT Next Visit Plan Light introduction to plyometrics; Right LE strengthening with progression of standing CKC & stability exercises, Manual therapy including taping PRN for ROM and edema management, Modailities PRN      Patient will benefit from skilled therapeutic intervention in order to improve the following deficits and impairments:  Pain, Impaired flexibility, Increased edema, Decreased range of motion, Decreased strength, Difficulty walking, Abnormal gait, Decreased activity tolerance  Visit Diagnosis: Pain in right knee  Stiffness of right knee, not elsewhere classified  Difficulty in walking, not elsewhere classified  Other abnormalities of gait and mobility     Problem List Patient Active Problem List   Diagnosis Date Noted  . Right knee injury 03/06/2015  . Left shoulder pain 01/11/2015  . Preventative health care 11/13/2014  . Lower back injury 03/18/2012    MBess Harvest PTA 08/15/2015, 5:10 PM  [facility] [place] [address]Phone:  [phone]  Fax:  [phone] Name: Karen STREAMMRN: [id number] of Birth: [birth year]

## 2015-08-20 ENCOUNTER — Ambulatory Visit: Payer: PRIVATE HEALTH INSURANCE | Attending: Orthopedic Surgery | Admitting: Physical Therapy

## 2015-08-20 DIAGNOSIS — R2689 Other abnormalities of gait and mobility: Secondary | ICD-10-CM | POA: Insufficient documentation

## 2015-08-20 DIAGNOSIS — M25661 Stiffness of right knee, not elsewhere classified: Secondary | ICD-10-CM | POA: Diagnosis present

## 2015-08-20 DIAGNOSIS — M25561 Pain in right knee: Secondary | ICD-10-CM | POA: Insufficient documentation

## 2015-08-20 DIAGNOSIS — R262 Difficulty in walking, not elsewhere classified: Secondary | ICD-10-CM | POA: Diagnosis present

## 2015-08-20 NOTE — Therapy (Signed)
[facility] [place] [address]  [address] [place], [place], [address] Phone: [phone]   Fax:  [phone]  Physical Therapy Treatment  Patient Details  Name: [name] MRN: [id number] Date of Birth: 05/10/1975 Referring Provider: Frank J. Rowan, MD  Encounter Date: 08/20/2015      PT End of Session - 08/20/15 0934    Visit Number 28  [place] on 08/06/15   Number of Visits 30   Date for PT Re-Evaluation 08/09/15   Authorization Type Worker's comp   Authorization - Visit Number 18   Authorization - Number of Visits 20   PT Start Time 0930   PT Stop Time 1014   PT Time Calculation (min) 44 min   Activity Tolerance Patient tolerated treatment well   Behavior During Therapy WFL for tasks assessed/performed      Past Medical History  Diagnosis Date  . H/O pyelonephritis 2001 & 2009  . Acute medial meniscus tear of right knee     Past Surgical History  Procedure Laterality Date  . Appendectomy  age 8  . Knee arthroscopy Right 06/08/2015    Procedure: RIGHT KNEE ARTHROSCOPY WITH DEBRIDEMENT;  Surgeon: Frank Rowan, MD;  Location: [facility];  Service: Orthopedics;  Laterality: Right;    There were no vitals filed for this visit.      Subjective Assessment - 08/20/15 0932    Subjective Pt reports she has been doing some light running (~1.5 miles) on mixed terrain over the weekend and reports no pain except some discomfort on downhill. Also notes some discomfort when carrying something heavy (such as her child) down the stairs.   Patient Stated Goals "Get back to training for running"   Currently in Pain? No/denies            [facility] PT Assessment - 08/20/15 0930    Assessment   Medical Diagnosis R patella fracture s/p knee scope 06/08/15   Referring Provider Frank J. Rowan, MD   Onset Date/Surgical Date 06/08/15  R knee scope; initial injury 03/05/2015   Next MD Visit 08/22/15   Strength   Strength Assessment Site Hip;Knee   Right/Left Hip Right   Right Hip Flexion 5/5   Right Hip Extension --  5-/5   Right Hip ABduction 5/5   Right Hip ADduction --  5-/5   Right/Left Knee Right   Right Knee Flexion 5/5   Right Knee Extension --  5-/5          Today's Treatment  TherEx Elliptical: level 4.0, 5 min TRX deep squatting x15 R SLS pistol squat with TRX x10 Resisted gait (all directions) x25ft B Fwd Step-up to 9" stool with blue foam oval + opposite LE Hip flexion/Knee extension with green TB x10 each B SL lunge on blue foam oval with opposite foot suspended in TRX x10 each Lunge walking with trunk rotation holding 7# db 2x25ft High knee skipping 2x25ft R Hip flexor/RF/quad stretch in lunge position with knee on blue foam oval 2x30" Side stepping with blue TB 2x25ft  Monster walk with blue TB 2x25ft           PT Long Term Goals - 08/20/15 0943    PT LONG TERM GOAL #1   Title Independent with HEP/gym program by 08/09/15   Status Achieved  Independent with current HEP, with continued progression as indicated.   PT LONG TERM GOAL #2   Title Pt will demonstrate right knee ROM 0-140   or greater without pain by 08/01/15   Status Achieved  R knee AROM:  -2-144   PT LONG TERM GOAL #3   Title Pt will demonstrate right LE strength 5-/5 or greater by 08/09/15   Status Achieved   PT LONG TERM GOAL #4   Title Pt will ambulate with normal gait pattern without AD on all surfaces by 05/22/15   Status Achieved   PT LONG TERM GOAL #5   Title Pt will ascend/descend stairs reciprocally with normal step pattern by 08/09/15   Status Achieved  Pt able to ascend stairs reciprocally with normal step pattern w/o use of rail with no hesitation, but still notes increased pain when carrying a heavy load.   PT LONG TERM GOAL #6   Title Pt will report performance of normal household chores and job tasks without limitation due to right knee by 08/09/15   Status Partially Met  Pt only  limted at work/home by low squating (to get in to low cabinets at home/work or med fridge at work) and descending stairs while carrying heavy objects (children).   PT LONG TERM GOAL #7   Title Pt will resume running at least 1 mile without increased R knee pain by 07/2515   Status Partially Met  Pt reports she has attempted some light run/walk combo 1.5-2.5 miles but has yet to run full speed for >1 mile.               Plan - 08/20/15 1014    Clinical Impression Statement Pt remains painfree with normal daily activities except some discomfort when running downhill or carrying heavy loads down stairs and brief sharp pain in anterior R knee with deep squat such as when reaching down into lower cabinets. Has been able to resume some light running on varied terrains but has not attempted longer continuous run. R knee ROM symmetrical with L but mild quad tightness noted at end range flexion on R limiting ability to comfortably assume full kneeling position. Strength improved R LE to 5/5 excepting knee extension, hip extension and adduction 5-/5. Pt doing very well overall and should be ready for discharge as planned  next week.   PT Treatment/Interventions Therapeutic exercise;Manual techniques;Passive range of motion;Taping;Therapeutic activities;Balance training;Neuromuscular re-education;Gait training;Stair training;Cryotherapy;Vasopneumatic Device;Electrical Stimulation;Iontophoresis 67m/ml Dexamethasone;Patient/family education   PT Next Visit Plan Light introduction to plyometrics; Right LE strengthening with progression of standing CKC & stability exercises, Manual therapy including taping PRN for ROM and edema management, Modailities PRN   Consulted and Agree with Plan of Care Patient      Patient will benefit from skilled therapeutic intervention in order to improve the following deficits and impairments:  Pain, Impaired flexibility, Increased edema, Decreased range of motion, Decreased  strength, Difficulty walking, Abnormal gait, Decreased activity tolerance  Visit Diagnosis: Pain in right knee  Stiffness of right knee, not elsewhere classified  Difficulty in walking, not elsewhere classified  Other abnormalities of gait and mobility     Problem List Patient Active Problem List   Diagnosis Date Noted  . Right knee injury 03/06/2015  . Left shoulder pain 01/11/2015  . Preventative health care 11/13/2014  . Lower back injury 03/18/2012    JPercival Spanish PT, MPT 08/20/2015, 12:24 PM  [facility] [place] [address]Phone: [phone]  Fax:  [phone] Name: Karen FLEEGERMRN: [id number] of Birth: 817-Jul-1977

## 2015-08-23 ENCOUNTER — Ambulatory Visit: Payer: PRIVATE HEALTH INSURANCE | Admitting: Physical Therapy

## 2015-08-23 DIAGNOSIS — M25561 Pain in right knee: Secondary | ICD-10-CM | POA: Diagnosis not present

## 2015-08-23 DIAGNOSIS — M25661 Stiffness of right knee, not elsewhere classified: Secondary | ICD-10-CM

## 2015-08-23 DIAGNOSIS — R262 Difficulty in walking, not elsewhere classified: Secondary | ICD-10-CM

## 2015-08-23 DIAGNOSIS — R2689 Other abnormalities of gait and mobility: Secondary | ICD-10-CM

## 2015-08-23 NOTE — Therapy (Signed)
[facility] [address]  [place], [place], [address] Phone: [phone]   Fax:  [phone]  Physical Therapy Treatment  Patient Details  Name: [name] MRN: [id number] Date of Birth: 12-07-1975 Referring Provider: Kathalene Frames. Mayer Camel, MD  Encounter Date: 08/23/2015      PT End of Session - 08/23/15 1318    Visit Number 29   Number of Visits 30   Date for PT Re-Evaluation 08/09/15   Authorization Type Worker's comp   Authorization - Visit Number 65   Authorization - Number of Visits 20   PT Start Time 4166   PT Stop Time 1358   PT Time Calculation (min) 40 min   Activity Tolerance Patient tolerated treatment well   Behavior During Therapy WFL for tasks assessed/performed      Past Medical History  Diagnosis Date  . H/O pyelonephritis 2001 & 2009  . Acute medial meniscus tear of right knee     Past Surgical History  Procedure Laterality Date  . Appendectomy  age 27  . Knee arthroscopy Right 06/08/2015    Procedure: RIGHT KNEE ARTHROSCOPY WITH DEBRIDEMENT;  Surgeon: Frederik Pear, MD;  Location: [facility];  Service: Orthopedics;  Laterality: Right;    There were no vitals filed for this visit.      Subjective Assessment - 08/23/15 1336    Subjective Pt reporting increased muscle soreness after last therapy visit but no knee pain. Saw MD on Wednesday and was released.   Currently in Pain? No/denies            Today's Treatment  TherEx Elliptical: level 4.0, 5 min TRX deep squatting x15 B Fwd Step-up to 9" stool with blue foam oval + opposite LE Hip flexion/Knee extension with green TB x10 each DL stance on BOSU (inverted) with red med ball (Gr 1000) rebounder toss x25 B SLS on BOSU  (inverted) with red med ball (Gr 1000) rebounder toss x25 each Side stepping with blue TB 2x80f  Monster walk fwd/back with blue TB 2x243f 4 square hopping CW/CCW x3 revolutions each Lateral  alternating SL hopping x15 B Lunge jump x10 each           PT Long Term Goals - 08/20/15 090630  PT LONG TERM GOAL #1   Title Independent with HEP/gym program by 08/09/15   Status Achieved  Independent with current HEP, with continued progression as indicated.   PT LONG TERM GOAL #2   Title Pt will demonstrate right knee ROM 0-140 or greater without pain by 08/01/15   Status Achieved  R knee AROM:  -2-144   PT LONG TERM GOAL #3   Title Pt will demonstrate right LE strength 5-/5 or greater by 08/09/15   Status Achieved   PT LONG TERM GOAL #4   Title Pt will ambulate with normal gait pattern without AD on all surfaces by 05/22/15   Status Achieved   PT LONG TERM GOAL #5   Title Pt will ascend/descend stairs reciprocally with normal step pattern by 08/09/15   Status Achieved  Pt able to ascend stairs reciprocally with normal step pattern w/o use of rail with no hesitation, but still notes increased pain when carrying a heavy load.   PT LONG TERM GOAL #6   Title Pt will report performance of normal household chores and job tasks without limitation due to right knee by 08/09/15   Status Partially Met  Pt  only limted at work/home by low squating (to get in to low cabinets at home/work or med fridge at work) and descending stairs while carrying heavy objects (children).   PT LONG TERM GOAL #7   Title Pt will resume running at least 1 mile without increased R knee pain by 07/2515   Status Partially Met  Pt reports she has attempted some light run/walk combo 1.5-2.5 miles but has yet to run full speed for >1 mile.               Plan - 08/23/15 1401    Clinical Impression Statement Pt noting some increased muscle soreness after last PT visit, but no knee pain. Seen by MD and released to resume all normal activities with no further f/u with MD planned. Tolerating light plyometrics but with fatigue noted. Reviewed HEP with slight modifications/additions made and encouraged continued  activity and HEP/gym exercises upon completion of PT. Plan to proceed with D/C as planned next visit.   PT Frequency 5x / week   PT Treatment/Interventions Therapeutic exercise;Manual techniques;Passive range of motion;Taping;Therapeutic activities;Balance training;Neuromuscular re-education;Gait training;Stair training;Cryotherapy;Vasopneumatic Device;Electrical Stimulation;Iontophoresis '4mg'$ /ml Dexamethasone;Patient/family education   PT Next Visit Plan Discharge assessment, final review of HEP as needed   Consulted and Agree with Plan of Care Patient      Patient will benefit from skilled therapeutic intervention in order to improve the following deficits and impairments:  Pain, Impaired flexibility, Increased edema, Decreased range of motion, Decreased strength, Difficulty walking, Abnormal gait, Decreased activity tolerance  Visit Diagnosis: Pain in right knee  Stiffness of right knee, not elsewhere classified  Difficulty in walking, not elsewhere classified  Other abnormalities of gait and mobility     Problem List Patient Active Problem List   Diagnosis Date Noted  . Right knee injury 03/06/2015  . Left shoulder pain 01/11/2015  . Preventative health care 11/13/2014  . Lower back injury 03/18/2012    Percival Spanish, PT, MPT 08/23/2015, 2:07 PM  [facility] [address]  [place], [place], [address] Phone: [phone]   Fax:  [phone]  Name: [name] MRN: [id number] Date of Birth: [birth year]

## 2015-08-27 ENCOUNTER — Ambulatory Visit: Payer: PRIVATE HEALTH INSURANCE

## 2015-08-30 ENCOUNTER — Ambulatory Visit: Payer: PRIVATE HEALTH INSURANCE | Admitting: Physical Therapy

## 2015-08-30 DIAGNOSIS — M25661 Stiffness of right knee, not elsewhere classified: Secondary | ICD-10-CM

## 2015-08-30 DIAGNOSIS — M25561 Pain in right knee: Secondary | ICD-10-CM

## 2015-08-30 DIAGNOSIS — R2689 Other abnormalities of gait and mobility: Secondary | ICD-10-CM

## 2015-08-30 DIAGNOSIS — R262 Difficulty in walking, not elsewhere classified: Secondary | ICD-10-CM

## 2015-08-30 NOTE — Therapy (Signed)
[facility] [address]  [place], [place], [address] Phone: [phone]   Fax:  [phone]  Physical Therapy Treatment  Patient Details  Name: [name] MRN: [id number] Date of Birth: 1975/06/02 Referring Provider: Kathalene Frames. Mayer Camel, MD  Encounter Date: 08/30/2015      PT End of Session - 08/30/15 0849    Visit Number 30   Number of Visits 30   Date for PT Re-Evaluation 08/09/15   Authorization Type Worker's comp   Authorization - Visit Number 77   Authorization - Number of Visits 20   PT Start Time 0845   PT Stop Time 0906   PT Time Calculation (min) 21 min   Activity Tolerance Patient tolerated treatment well   Behavior During Therapy [facility] for tasks assessed/performed      Past Medical History  Diagnosis Date  . H/O pyelonephritis 2001 & 2009  . Acute medial meniscus tear of right knee     Past Surgical History  Procedure Laterality Date  . Appendectomy  age 89  . Knee arthroscopy Right 06/08/2015    Procedure: RIGHT KNEE ARTHROSCOPY WITH DEBRIDEMENT;  Surgeon: Frederik Pear, MD;  Location: [facility];  Service: Orthopedics;  Laterality: Right;    There were no vitals filed for this visit.      Subjective Assessment - 08/30/15 0848    Subjective Pt  reports she ran 3 miles in 30 min (running 23 min, walking 7 min) on treadmill with no pain.   Patient Stated Goals "Get back to training for running"   Currently in Pain? No/denies            [facility] PT Assessment - 08/30/15 0845    Assessment   Medical Diagnosis R patella fracture s/p knee scope 06/08/15   Referring Provider Kathalene Frames. Mayer Camel, MD   Onset Date/Surgical Date 06/08/15  R knee scope; initial injury 03/05/2015   Observation/Other Assessments   Focus on Therapeutic Outcomes (FOTO)  Knee - 89% (11% limitation)   ROM / Strength   AROM / PROM / Strength Strength   Strength   Strength Assessment Site Hip;Knee   Right  Hip Flexion 5/5   Right Hip Extension --  5-/5   Right Hip ABduction 5/5   Right Hip ADduction 5/5   Right Knee Flexion 5/5   Right Knee Extension 5/5           Today's Treatment  TherEx Elliptical: level 4.3, 5 min  MMT  Goal Assessment  Review of HEP and safe progression of activities, especially walking and running           PT Long Term Goals - 08/30/15 0855    PT LONG TERM GOAL #1   Title Independent with HEP/gym program by 08/09/15   Status Achieved   PT LONG TERM GOAL #2   Title Pt will demonstrate right knee ROM 0-140 or greater without pain by 08/01/15   Status Achieved   PT LONG TERM GOAL #3   Title Pt will demonstrate right LE strength 5-/5 or greater by 08/09/15   Status Achieved   PT LONG TERM GOAL #4   Title Pt will ambulate with normal gait pattern without AD on all surfaces by 05/22/15   Status Achieved   PT LONG TERM GOAL #5   Title Pt will ascend/descend stairs reciprocally with normal step pattern by 08/09/15   Status Achieved   PT LONG TERM GOAL #6  Title Pt will report performance of normal household chores and job tasks without limitation due to right knee by 08/09/15   Status Achieved   PT LONG TERM GOAL #7   Title Pt will resume running at least 1 mile without increased R knee pain by 07/2515   Status Achieved               Plan - 08/30/15 0908    Clinical Impression Statement Pt has demonstrated excellent progress with PT with R knee now painfree other than some slight post work-out muscle soreness at times and occasional brief pain with deep squat to low cabinets but pt reports she has typically been able to avoid this pain with awareness of movement/positioning when squatting. Full R knee ROM restored and R LE strength 5/5 except hip extension 5-/5. Pt has been able to resume normal daily activities and has started back to running (running up to 3 miles currently) without limitation due to R knee pain. HEP reviewed along with  discussion of safe activity progression, with pt verbalizing good understanding. All goals met for this episode and pt pleased with progress, therefore will proceed with discharge from PT at this time.   PT Frequency 5x / week   PT Treatment/Interventions Therapeutic exercise;Manual techniques;Passive range of motion;Taping;Therapeutic activities;Balance training;Neuromuscular re-education;Gait training;Stair training;Cryotherapy;Vasopneumatic Device;Electrical Stimulation;Iontophoresis '4mg'$ /ml Dexamethasone;Patient/family education   PT Next Visit Plan Discharge    Consulted and Agree with Plan of Care Patient      Patient will benefit from skilled therapeutic intervention in order to improve the following deficits and impairments:  Pain, Impaired flexibility, Increased edema, Decreased range of motion, Decreased strength, Difficulty walking, Abnormal gait, Decreased activity tolerance  Visit Diagnosis: Pain in right knee  Stiffness of right knee, not elsewhere classified  Difficulty in walking, not elsewhere classified  Other abnormalities of gait and mobility     Problem List Patient Active Problem List   Diagnosis Date Noted  . Right knee injury 03/06/2015  . Left shoulder pain 01/11/2015  . Preventative health care 11/13/2014  . Lower back injury 03/18/2012    Percival Spanish, PT, MPT 08/30/2015, 9:19 AM  [facility] [address]  [place], [place], [address] Phone: [phone]   Fax:  [phone]  Name: [name] MRN: [id number] Date of Birth: March 22, 1975   PHYSICAL THERAPY DISCHARGE SUMMARY  Visits from Start of Care: 30  Current functional level related to goals / functional outcomes:   See above Clinical Impression   Remaining deficits:   Occasional brief R knee pain with deep squat, but managed with awareness of squatting technique. Pt to continue with HEP and continue to slowly increase  running tolerance.   Education / Equipment:   HEP  Plan: Patient agrees to discharge.  Patient goals were met. Patient is being discharged due to meeting the stated rehab goals.  ?????       Percival Spanish, PT, MPT 08/30/2015, 9:22 AM  [facility] [address]  [place], [place], [address] Phone: [phone]   Fax:  [phone]

## 2015-12-18 ENCOUNTER — Other Ambulatory Visit (HOSPITAL_COMMUNITY)
Admission: RE | Admit: 2015-12-18 | Discharge: 2015-12-18 | Disposition: A | Discharge status: Home / Self Care | Payer: 59 | Source: Ambulatory Visit | Attending: Family | Admitting: Family

## 2015-12-18 ENCOUNTER — Encounter: Payer: Self-pay | Admitting: Family

## 2015-12-18 ENCOUNTER — Ambulatory Visit (INDEPENDENT_AMBULATORY_CARE_PROVIDER_SITE_OTHER): Payer: 59 | Admitting: Family

## 2015-12-18 ENCOUNTER — Ambulatory Visit (HOSPITAL_BASED_OUTPATIENT_CLINIC_OR_DEPARTMENT_OTHER)
Admission: RE | Admit: 2015-12-18 | Discharge: 2015-12-18 | Disposition: A | Discharge status: Home / Self Care | Payer: 59 | Source: Ambulatory Visit | Attending: Family | Admitting: Family

## 2015-12-18 VITALS — BP 133/74 | HR 70 | Temp 98.4°F | Resp 16 | Ht 65.5 in | Wt 136.4 lb

## 2015-12-18 DIAGNOSIS — Z1151 Encounter for screening for human papillomavirus (HPV): Secondary | ICD-10-CM | POA: Insufficient documentation

## 2015-12-18 DIAGNOSIS — Z1231 Encounter for screening mammogram for malignant neoplasm of breast: Secondary | ICD-10-CM | POA: Insufficient documentation

## 2015-12-18 DIAGNOSIS — Z01419 Encounter for gynecological examination (general) (routine) without abnormal findings: Secondary | ICD-10-CM

## 2015-12-18 DIAGNOSIS — Z0000 Encounter for general adult medical examination without abnormal findings: Secondary | ICD-10-CM | POA: Insufficient documentation

## 2015-12-18 LAB — CBC WITH DIFFERENTIAL/PLATELET
BASOS PCT: 0.5 % (ref 0.0–3.0)
Basophils Absolute: 0 10*3/uL (ref 0.0–0.1)
EOS PCT: 1.1 % (ref 0.0–5.0)
Eosinophils Absolute: 0.1 10*3/uL (ref 0.0–0.7)
HEMATOCRIT: 39.1 % (ref 36.0–46.0)
HEMOGLOBIN: 13.4 g/dL (ref 12.0–15.0)
Lymphocytes Relative: 30.1 % (ref 12.0–46.0)
Lymphs Abs: 1.4 10*3/uL (ref 0.7–4.0)
MCHC: 34.4 g/dL (ref 30.0–36.0)
MCV: 94.2 fl (ref 78.0–100.0)
MONO ABS: 0.5 10*3/uL (ref 0.1–1.0)
MONOS PCT: 9.5 % (ref 3.0–12.0)
Neutro Abs: 2.8 10*3/uL (ref 1.4–7.7)
Neutrophils Relative %: 58.8 % (ref 43.0–77.0)
Platelets: 223 10*3/uL (ref 150.0–400.0)
RBC: 4.15 Mil/uL (ref 3.87–5.11)
RDW: 12.7 % (ref 11.5–15.5)
WBC: 4.8 10*3/uL (ref 4.0–10.5)

## 2015-12-18 LAB — HEPATIC FUNCTION PANEL
ALBUMIN: 4.1 g/dL (ref 3.5–5.2)
ALT: 13 U/L (ref 0–35)
AST: 17 U/L (ref 0–37)
Alkaline Phosphatase: 40 U/L (ref 39–117)
BILIRUBIN TOTAL: 0.9 mg/dL (ref 0.2–1.2)
Bilirubin, Direct: 0.1 mg/dL (ref 0.0–0.3)
TOTAL PROTEIN: 7.1 g/dL (ref 6.0–8.3)

## 2015-12-18 LAB — LIPID PANEL
CHOLESTEROL: 172 mg/dL (ref 0–200)
HDL: 64.8 mg/dL (ref >39.00)
LDL Cholesterol: 96 mg/dL (ref 0–99)
NonHDL: 107.12
TRIGLYCERIDES: 54 mg/dL (ref 0.0–149.0)
Total CHOL/HDL Ratio: 3
VLDL: 10.8 mg/dL (ref 0.0–40.0)

## 2015-12-18 LAB — BASIC METABOLIC PANEL
BUN: 16 mg/dL (ref 6–23)
CHLORIDE: 102 meq/L (ref 96–112)
CO2: 30 mEq/L (ref 19–32)
Calcium: 9.3 mg/dL (ref 8.4–10.5)
Creatinine, Ser: 0.79 mg/dL (ref 0.40–1.20)
GFR: 85.19 mL/min (ref >60.00)
GLUCOSE: 79 mg/dL (ref 70–99)
POTASSIUM: 3.6 meq/L (ref 3.5–5.1)
SODIUM: 137 meq/L (ref 135–145)

## 2015-12-18 LAB — URINALYSIS, ROUTINE W REFLEX MICROSCOPIC
Bilirubin Urine: NEGATIVE
HGB URINE DIPSTICK: NEGATIVE
KETONES UR: NEGATIVE
LEUKOCYTES UA: NEGATIVE
NITRITE: NEGATIVE
RBC / HPF: NONE SEEN (ref 0–?)
Specific Gravity, Urine: 1.01 (ref 1.000–1.030)
TOTAL PROTEIN, URINE-UPE24: NEGATIVE
URINE GLUCOSE: NEGATIVE
UROBILINOGEN UA: 0.2 (ref 0.0–1.0)
WBC UA: NONE SEEN (ref 0–?)
pH: 6 (ref 5.0–8.0)

## 2015-12-18 LAB — TSH: TSH: 1.13 u[IU]/mL (ref 0.35–4.50)

## 2015-12-18 NOTE — Patient Instructions (Signed)
Please complete lab work prior to leaving. Schedule your mammogram on the first floor. Continue healthy diet and exercise.  Follow up in 1 year.

## 2015-12-18 NOTE — Progress Notes (Signed)
Pre visit review using our clinic review tool, if applicable. No additional management support is needed unless otherwise documented below in the visit note. 

## 2015-12-18 NOTE — Progress Notes (Signed)
Subjective:    Patient ID: [name], female    DOB: 1975-09-14, 40 y.o.   MRN: [id number]  HPI   Patient presents today for complete physical.  Immunizations: tdap 2009, flu shot last week Diet:healthy Exercise: daily exercise Pap Smear: 2012 Mammogram: 9/16 Dental: up to date  Wt Readings from Last 3 Encounters:  12/18/15 136 lb 6.4 oz (61.9 kg)  06/08/15 132 lb 6 oz (60 kg)  11/13/14 135 lb 12.8 oz (61.6 kg)   Review of Systems  Constitutional: Negative for unexpected weight change.  HENT: Negative for hearing loss and rhinorrhea.   Eyes: Negative for visual disturbance.  Respiratory: Negative for cough.   Cardiovascular: Negative for leg swelling.  Gastrointestinal: Negative for constipation and diarrhea.  Genitourinary: Negative for dysuria and frequency.  Musculoskeletal: Negative for arthralgias and myalgias.  Skin: Negative for rash.  Neurological: Negative for headaches.  Hematological: Negative for adenopathy.  Psychiatric/Behavioral:       Denies depression/anxiety       Past Medical History:  Diagnosis Date  . Acute medial meniscus tear of right knee   . H/O pyelonephritis 2001 & 2009     Social History   Social History  . Marital status: Married    Spouse name: N/A  . Number of children: 2  . Years of education: N/A   Occupational History  . 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  [employer]   Social History Main Topics  . Smoking status: Never Smoker  . Smokeless tobacco: Never Used  . Alcohol use 1.2 oz/week    2 Glasses of wine per week     Comment: social  . Drug use: No  . Sexual activity: Yes    Birth control/ protection: Condom   Other Topics Concern  . 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      Past Surgical History:  Procedure Laterality Date  . APPENDECTOMY  age 21  . KNEE ARTHROSCOPY Right 06/08/2015   Procedure: RIGHT KNEE ARTHROSCOPY WITH DEBRIDEMENT;  Surgeon: Frederik Pear, MD;  Location: [facility];  Service: Orthopedics;  Laterality: Right;    Family History  Problem Relation Age of Onset  . Hyperlipidemia Mother   . Hypertension Father   . Thyroid disease Father     hypethyroidism  . Hyperlipidemia Maternal Grandfather   . Hypertension Maternal Grandfather   . Heart attack Maternal Grandfather   . Cancer Maternal Grandfather     prostate  . Heart disease Maternal Grandfather   . Stroke Maternal Grandfather   . Hyperlipidemia Maternal Grandmother   . Cancer Maternal Grandmother     breast  . Diabetes Paternal Grandmother   . Arthritis Paternal Grandmother   . Heart attack Paternal Grandfather   . Asthma Neg Hx   . Alcohol abuse Neg Hx   . Drug abuse Neg Hx     Allergies  Allergen Reactions  . Tdap [Diphth-Acell Pertussis-Tetanus] Swelling    "Lot of swelling and scar tissue at injection site"    Current Outpatient Prescriptions on File Prior to Visit  Medication Sig Dispense Refill  . valACYclovir (VALTREX) 1000 MG tablet TAKE 1 TABLET BY MOUTH ONCE DAILY 30 tablet 1   No  current facility-administered medications on file prior to visit.     BP 133/74 (BP Location: Left Arm, Cuff Size: Normal)   Pulse 70   Temp 98.4 F (36.9 C) (Oral)   Resp 16   Ht 5' 5.5" (1.664 m)   Wt 136 lb 6.4 oz (61.9 kg)   LMP 11/25/2015   SpO2 100% Comment: room air  BMI 22.35 kg/m    Objective:   Physical Exam Physical Exam  Constitutional: She is oriented to person, place, and time. She appears well-developed and well-nourished. No distress.  HENT:  Head: Normocephalic and atraumatic.  Right Ear: Tympanic membrane and ear canal normal.  Left Ear: Tympanic membrane and ear canal normal.  Mouth/Throat: Oropharynx is clear and moist.  Eyes:  Pupils are equal, round, and reactive to light. No scleral icterus.  Neck: Normal range of motion. No thyromegaly present.  Cardiovascular: Normal rate and regular rhythm.   No murmur heard. Pulmonary/Chest: Effort normal and breath sounds normal. No respiratory distress. He has no wheezes. She has no rales. She exhibits no tenderness.  Abdominal: Soft. Bowel sounds are normal. She exhibits no distension and no mass. There is no tenderness. There is no rebound and no guarding.  Musculoskeletal: She exhibits no edema.  Lymphadenopathy:    She has no cervical adenopathy.  Neurological: She is alert and oriented to person, place, and time. She has normal patellar reflexes. She exhibits normal muscle tone. Coordination normal.  Skin: Skin is warm and dry.  Psychiatric: She has a normal mood and affect. Her behavior is normal. Judgment and thought content normal.  Breasts: Examined lying Right: Without masses, retractions, discharge or axillary adenopathy.  Left: Without masses, retractions, discharge or axillary adenopathy.  Inguinal/mons: Normal without inguinal adenopathy  External genitalia: Normal  BUS/Urethra/Skene's glands: Normal  Bladder: Normal  Vagina: Normal  Cervix: Normal  Uterus: normal in size, shape and contour. Midline and mobile  Adnexa/parametria:  Rt: Without masses or tenderness.  Lt: Without masses or tenderness.  Anus and perineum: Normal           Assessment & Plan:          Assessment & Plan:  EKG tracing is personally reviewed.  EKG notes NSR.  No acute changes.   Preventative- continue healthy diet, exercise. Obtain routine lab work.  Refer for mammogram, pap today.

## 2015-12-18 NOTE — Telephone Encounter (Signed)
Health Maintenance has been updated. 

## 2015-12-20 LAB — CYTOLOGY - PAP

## 2015-12-21 ENCOUNTER — Encounter: Payer: Self-pay | Admitting: Family

## 2015-12-28 NOTE — Telephone Encounter (Signed)
[name], could you please call [facility] and check status of her mammogram results?

## 2015-12-31 NOTE — Telephone Encounter (Signed)
Spoke with imaging department. They will call and get result and let us know outcome.

## 2016-01-01 ENCOUNTER — Encounter: Payer: Self-pay | Admitting: Family

## 2016-02-18 DIAGNOSIS — L814 Other melanin hyperpigmentation: Secondary | ICD-10-CM | POA: Diagnosis not present

## 2016-02-18 DIAGNOSIS — L821 Other seborrheic keratosis: Secondary | ICD-10-CM | POA: Diagnosis not present

## 2016-02-18 DIAGNOSIS — D2371 Other benign neoplasm of skin of right lower limb, including hip: Secondary | ICD-10-CM | POA: Diagnosis not present

## 2016-02-18 DIAGNOSIS — L819 Disorder of pigmentation, unspecified: Secondary | ICD-10-CM | POA: Diagnosis not present

## 2016-02-18 DIAGNOSIS — L918 Other hypertrophic disorders of the skin: Secondary | ICD-10-CM | POA: Diagnosis not present

## 2016-07-23 ENCOUNTER — Encounter: Payer: Self-pay | Admitting: Family

## 2016-07-23 MED ORDER — PROPRANOLOL HCL 40 MG PO TABS: ORAL_TABLET | ORAL | 0 refills | Status: DC

## 2016-07-23 MED FILL — PROPRANOLOL 10 MG TABLET: 10 | 5 days supply | Qty: 20 | Fill #0

## 2017-02-18 ENCOUNTER — Other Ambulatory Visit: Payer: Self-pay | Admitting: Family

## 2017-02-18 DIAGNOSIS — Z1231 Encounter for screening mammogram for malignant neoplasm of breast: Secondary | ICD-10-CM

## 2017-02-20 ENCOUNTER — Ambulatory Visit (HOSPITAL_BASED_OUTPATIENT_CLINIC_OR_DEPARTMENT_OTHER)
Admission: RE | Admit: 2017-02-20 | Discharge: 2017-02-20 | Disposition: A | Discharge status: Home / Self Care | Payer: 59 | Source: Ambulatory Visit | Attending: Family | Admitting: Family

## 2017-02-20 DIAGNOSIS — Z1231 Encounter for screening mammogram for malignant neoplasm of breast: Secondary | ICD-10-CM | POA: Diagnosis not present

## 2017-03-04 ENCOUNTER — Encounter: Payer: Self-pay | Admitting: Family

## 2017-03-04 DIAGNOSIS — H5213 Myopia, bilateral: Secondary | ICD-10-CM | POA: Diagnosis not present

## 2017-03-18 ENCOUNTER — Other Ambulatory Visit (INDEPENDENT_AMBULATORY_CARE_PROVIDER_SITE_OTHER): Payer: 59

## 2017-03-18 ENCOUNTER — Encounter: Payer: Self-pay | Admitting: Family

## 2017-03-18 ENCOUNTER — Telehealth: Payer: Self-pay | Admitting: *Deleted

## 2017-03-18 DIAGNOSIS — Z0000 Encounter for general adult medical examination without abnormal findings: Secondary | ICD-10-CM

## 2017-03-18 LAB — CBC WITH DIFFERENTIAL/PLATELET
Basophils Absolute: 0 10*3/uL (ref 0.0–0.1)
Basophils Relative: 0.4 % (ref 0.0–3.0)
EOS ABS: 0.1 10*3/uL (ref 0.0–0.7)
EOS PCT: 0.8 % (ref 0.0–5.0)
HCT: 44.4 % (ref 36.0–46.0)
Hemoglobin: 14.8 g/dL (ref 12.0–15.0)
LYMPHS ABS: 1.7 10*3/uL (ref 0.7–4.0)
Lymphocytes Relative: 25.2 % (ref 12.0–46.0)
MCHC: 33.4 g/dL (ref 30.0–36.0)
MCV: 97.7 fl (ref 78.0–100.0)
MONO ABS: 0.6 10*3/uL (ref 0.1–1.0)
Monocytes Relative: 9.5 % (ref 3.0–12.0)
NEUTROS PCT: 64.1 % (ref 43.0–77.0)
Neutro Abs: 4.3 10*3/uL (ref 1.4–7.7)
Platelets: 261 10*3/uL (ref 150.0–400.0)
RBC: 4.55 Mil/uL (ref 3.87–5.11)
RDW: 12.8 % (ref 11.5–15.5)
WBC: 6.7 10*3/uL (ref 4.0–10.5)

## 2017-03-18 LAB — URINALYSIS, ROUTINE W REFLEX MICROSCOPIC
Bilirubin Urine: NEGATIVE
HGB URINE DIPSTICK: NEGATIVE
Ketones, ur: NEGATIVE
Leukocytes, UA: NEGATIVE
NITRITE: NEGATIVE
RBC / HPF: NONE SEEN (ref 0–?)
Specific Gravity, Urine: <=1.005 — AB (ref 1.000–1.030)
Total Protein, Urine: NEGATIVE
URINE GLUCOSE: NEGATIVE
Urobilinogen, UA: 0.2 (ref 0.0–1.0)
WBC UA: NONE SEEN (ref 0–?)
pH: 6.5 (ref 5.0–8.0)

## 2017-03-18 LAB — HEPATIC FUNCTION PANEL
ALT: 22 U/L (ref 0–35)
AST: 25 U/L (ref 0–37)
Albumin: 4.6 g/dL (ref 3.5–5.2)
Alkaline Phosphatase: 55 U/L (ref 39–117)
BILIRUBIN DIRECT: 0.2 mg/dL (ref 0.0–0.3)
BILIRUBIN TOTAL: 1.1 mg/dL (ref 0.2–1.2)
Total Protein: 7.6 g/dL (ref 6.0–8.3)

## 2017-03-18 LAB — LIPID PANEL
CHOLESTEROL: 197 mg/dL (ref 0–200)
HDL: 73.2 mg/dL (ref >39.00)
LDL CALC: 110 mg/dL — AB (ref 0–99)
NonHDL: 123.9
TRIGLYCERIDES: 69 mg/dL (ref 0.0–149.0)
Total CHOL/HDL Ratio: 3
VLDL: 13.8 mg/dL (ref 0.0–40.0)

## 2017-03-18 LAB — BASIC METABOLIC PANEL
BUN: 17 mg/dL (ref 6–23)
CO2: 28 mEq/L (ref 19–32)
CREATININE: 0.77 mg/dL (ref 0.40–1.20)
Calcium: 10.1 mg/dL (ref 8.4–10.5)
Chloride: 102 mEq/L (ref 96–112)
GFR: 87.21 mL/min (ref >60.00)
GLUCOSE: 102 mg/dL — AB (ref 70–99)
POTASSIUM: 5.2 meq/L — AB (ref 3.5–5.1)
Sodium: 139 mEq/L (ref 135–145)

## 2017-03-18 LAB — TSH: TSH: 1.66 u[IU]/mL (ref 0.35–4.50)

## 2017-03-18 NOTE — Telephone Encounter (Signed)
Pt has CPE scheduled for 03/20/17 and is wanting to complete labs today.  Please advise what should be ordered?

## 2017-03-18 NOTE — Telephone Encounter (Signed)
Message sent to pt.

## 2017-03-18 NOTE — Telephone Encounter (Signed)
Orders have been placed. Thanks!

## 2017-03-20 ENCOUNTER — Ambulatory Visit (INDEPENDENT_AMBULATORY_CARE_PROVIDER_SITE_OTHER): Payer: 59 | Admitting: Family

## 2017-03-20 ENCOUNTER — Encounter: Payer: Self-pay | Admitting: Family

## 2017-03-20 ENCOUNTER — Encounter: Payer: 59 | Admitting: Family

## 2017-03-20 VITALS — BP 126/76 | HR 68 | Temp 98.3°F | Resp 16 | Ht 65.5 in | Wt 144.0 lb

## 2017-03-20 DIAGNOSIS — E875 Hyperkalemia: Secondary | ICD-10-CM

## 2017-03-20 DIAGNOSIS — R221 Localized swelling, mass and lump, neck: Secondary | ICD-10-CM | POA: Diagnosis not present

## 2017-03-20 DIAGNOSIS — Z0000 Encounter for general adult medical examination without abnormal findings: Secondary | ICD-10-CM

## 2017-03-20 NOTE — Patient Instructions (Signed)
Continue healthy diet and regular exercise.  Neck ultrasound will be scheduled on the first floor. Complete lab work prior to leaving.

## 2017-03-20 NOTE — Progress Notes (Signed)
Subjective:    Patient ID: [name], female    DOB: 01/29/1976, 42 y.o.   MRN: [id number]  HPI  Patient presents today for complete physical.  Immunizations: Tdap is due Diet:  Reports that diet is improving Wt Readings from Last 3 Encounters:  03/20/17 144 lb (65.3 kg)  12/18/15 136 lb 6.4 oz (61.9 kg)  06/08/15 132 lb 6 oz (60 kg)  Exercise: exercising 5-6 days a week  Pap Smear: 12/18/15 Mammogram:02/20/17  Hyperkalemia- noted on recent BMET.  Neck mass- noted fullness at base of left neck, wonders if it is her thyroid   Review of Systems  Constitutional: Negative for unexpected weight change.  HENT: Negative for hearing loss and rhinorrhea.   Eyes: Negative for visual disturbance.  Respiratory: Negative for cough.   Cardiovascular: Negative for leg swelling.  Gastrointestinal: Negative for constipation and diarrhea.  Genitourinary: Negative for dysuria and frequency.  Musculoskeletal: Negative for arthralgias and myalgias.  Neurological: Negative for headaches.  Hematological: Negative for adenopathy.  Psychiatric/Behavioral:       Denies depression/anxiety       Past Medical History:  Diagnosis Date  . Acute medial meniscus tear of right knee   . H/O pyelonephritis 2001 & 2009     Social History   Socioeconomic History  . Marital status: Married    Spouse name: Not on file  . Number of children: 2  . Years of education: Not on file  . Highest education level: Not on file  Social Needs  . Financial resource strain: Not on file  . Food insecurity - worry: Not on file  . Food insecurity - inability: Not on file  . Transportation needs - medical: Not on file  . Transportation needs - non-medical: Not on file  Occupational History  . 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 Smoking status:  Never Smoker  . Smokeless tobacco: Never Used  Substance and Sexual Activity  . Alcohol use: Yes    Alcohol/week: 1.2 oz    Types: 2 Glasses of wine per week    Comment: social  . Drug use: No  . Sexual activity: Yes    Birth control/protection: Condom  Other Topics Concern  . 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     Past Surgical History:  Procedure Laterality Date  . APPENDECTOMY  age 73  . KNEE ARTHROSCOPY Right 06/08/2015   Procedure: RIGHT KNEE ARTHROSCOPY WITH DEBRIDEMENT;  Surgeon: Frederik Pear, MD;  Location: [place];  Service: Orthopedics;  Laterality: Right;    Family History  Problem Relation Age of Onset  . Hyperlipidemia Mother   . Hypertension Father   . Thyroid disease Father        hypethyroidism  . Hyperlipidemia Maternal Grandfather   . Hypertension Maternal Grandfather   . Heart attack Maternal Grandfather   . Cancer Maternal Grandfather        prostate  . Heart disease Maternal Grandfather   . Stroke Maternal Grandfather   . Hyperlipidemia Maternal Grandmother   . Cancer Maternal Grandmother        breast  . Diabetes Paternal Grandmother   .  Arthritis Paternal Grandmother   . Heart attack Paternal Grandfather   . Asthma Neg Hx   . Alcohol abuse Neg Hx   . Drug abuse Neg Hx     Allergies  Allergen Reactions  . Tdap [Diphth-Acell Pertussis-Tetanus] Swelling    "Lot of swelling and scar tissue at injection site"    Current Outpatient Medications on File Prior to Visit  Medication Sig Dispense Refill  . propranolol (INDERAL) 40 MG tablet 1 tablet by mouth 60-90 minutes prior to presentation 5 tablet 0  . valACYclovir (VALTREX) 1000 MG tablet TAKE 1 TABLET BY MOUTH ONCE DAILY 30 tablet 1   No current facility-administered medications on file prior to visit.     BP 126/76 (BP Location: Left  Arm, Patient Position: Sitting, Cuff Size: Small)   Pulse 68   Temp 98.3 F (36.8 C) (Oral)   Resp 16   Ht 5' 5.5" (1.664 m)   Wt 144 lb (65.3 kg)   LMP 03/06/2017   SpO2 100%   BMI 23.60 kg/m    Objective:   Physical Exam  Physical Exam  Constitutional: She is oriented to person, place, and time. She appears well-developed and well-nourished. No distress.  HENT:  Head: Normocephalic and atraumatic.  Right Ear: Tympanic membrane and ear canal normal.  Left Ear: Tympanic membrane and ear canal normal.  Mouth/Throat: Oropharynx is clear and moist.  Eyes: Pupils are equal, round, and reactive to light. No scleral icterus.  Neck: Normal range of motion. No thyromegaly present. There is some asymmetric fullness at the left neck base- non-tender.   Cardiovascular: Normal rate and regular rhythm.   No murmur heard. Pulmonary/Chest: Effort normal and breath sounds normal. No respiratory distress. He has no wheezes. She has no rales. She exhibits no tenderness.  Abdominal: Soft. Bowel sounds are normal. She exhibits no distension and no mass. There is no tenderness. There is no rebound and no guarding.  Musculoskeletal: She exhibits no edema.  Lymphadenopathy:    She has no cervical adenopathy.  Neurological: She is alert and oriented to person, place, and time. She has normal patellar reflexes. She exhibits normal muscle tone. Coordination normal.  Skin: Skin is warm and dry.  Psychiatric: She has a normal mood and affect. Her behavior is normal. Judgment and thought content normal.  Breasts: Examined lying Right: Without masses, retractions, discharge or axillary adenopathy.  Left: Without masses, retractions, discharge or axillary adenopathy.  Pelvic: deferred           Assessment & Plan:   Preventative care- discussed continuing healthy diet and regular exercise. Pap/mammo up to date. Has had reaction to Tdap in the past- will defer tetanus shot for now.  EKG tracing is  personally reviewed.  EKG notes NSR.  No acute changes.   Hyperkalemia- repeat bmet.  Neck fullness- will obtain US to further evaluate. I think that this is is asymmetric muscle tissue but I would like to make certain.      Assessment & Plan:

## 2017-03-20 NOTE — Addendum Note (Signed)
Addended by: Harl Bowie on: 03/20/2017 03:34 PM   Modules accepted: Orders

## 2017-03-21 LAB — BASIC METABOLIC PANEL
BUN / CREAT RATIO: 31 (calc) — AB (ref 6–22)
BUN: 26 mg/dL — AB (ref 7–25)
CO2: 30 mmol/L (ref 20–32)
Calcium: 10 mg/dL (ref 8.6–10.2)
Chloride: 102 mmol/L (ref 98–110)
Creat: 0.85 mg/dL (ref 0.50–1.10)
Glucose, Bld: 98 mg/dL (ref 65–99)
POTASSIUM: 5.3 mmol/L (ref 3.5–5.3)
SODIUM: 137 mmol/L (ref 135–146)

## 2017-03-23 ENCOUNTER — Telehealth: Payer: Self-pay | Admitting: Family

## 2017-03-23 DIAGNOSIS — E875 Hyperkalemia: Secondary | ICD-10-CM

## 2017-03-23 NOTE — Telephone Encounter (Signed)
Spoke with pt re: potassium results. She reports that she does drink a pre-work out supplement every AM. That is only change. I advised her to stop supplement and avoid large amounts of foods high in potassium.  She will return to the lab in 2 weeks.

## 2017-03-26 ENCOUNTER — Telehealth: Payer: Self-pay | Admitting: Family

## 2017-03-26 ENCOUNTER — Ambulatory Visit (HOSPITAL_BASED_OUTPATIENT_CLINIC_OR_DEPARTMENT_OTHER)
Admission: RE | Admit: 2017-03-26 | Discharge: 2017-03-26 | Disposition: A | Discharge status: Home / Self Care | Payer: 59 | Source: Ambulatory Visit | Attending: Family | Admitting: Family

## 2017-03-26 DIAGNOSIS — R221 Localized swelling, mass and lump, neck: Secondary | ICD-10-CM

## 2017-03-26 NOTE — Telephone Encounter (Signed)
Copied from [place] [id number]. Topic: Quick Communication - See Telephone Encounter >> Mar 26, 2017  4:47 PM Cleaster Corin, NT wrote: CRM for notification. See Telephone encounter for:   03/26/17. Sarah from [facility] calling to speak with [facility] about pt. Referral.  Asked about in reference to and she said she will just like for Gween to give her a call back. Judson Roch can be reached ar [phone].

## 2017-03-26 NOTE — Telephone Encounter (Signed)
Left voicemail for pt to check mychart.

## 2017-04-06 ENCOUNTER — Other Ambulatory Visit (INDEPENDENT_AMBULATORY_CARE_PROVIDER_SITE_OTHER): Payer: 59

## 2017-04-06 ENCOUNTER — Encounter: Payer: Self-pay | Admitting: Family

## 2017-04-06 DIAGNOSIS — E875 Hyperkalemia: Secondary | ICD-10-CM | POA: Diagnosis not present

## 2017-04-06 NOTE — Addendum Note (Signed)
Addended by: Caffie Pinto on: 04/06/2017 03:07 PM   Modules accepted: Orders

## 2017-04-06 NOTE — Addendum Note (Signed)
Addended by: Kelle Darting A on: 04/06/2017 02:00 PM   Modules accepted: Orders

## 2017-04-07 LAB — BASIC METABOLIC PANEL
BUN: 18 mg/dL (ref 6–23)
CHLORIDE: 100 meq/L (ref 96–112)
CO2: 30 meq/L (ref 19–32)
CREATININE: 1.11 mg/dL (ref 0.40–1.20)
Calcium: 9.7 mg/dL (ref 8.4–10.5)
GFR: 57.17 mL/min — ABNORMAL LOW (ref >60.00)
Glucose, Bld: 68 mg/dL — ABNORMAL LOW (ref 70–99)
Potassium: 3.9 mEq/L (ref 3.5–5.1)
Sodium: 137 mEq/L (ref 135–145)

## 2017-04-08 ENCOUNTER — Encounter: Payer: Self-pay | Admitting: Family

## 2017-04-15 DIAGNOSIS — Q180 Sinus, fistula and cyst of branchial cleft: Secondary | ICD-10-CM | POA: Diagnosis not present

## 2017-04-29 ENCOUNTER — Other Ambulatory Visit (INDEPENDENT_AMBULATORY_CARE_PROVIDER_SITE_OTHER): Payer: 59

## 2017-05-06 DIAGNOSIS — R221 Localized swelling, mass and lump, neck: Secondary | ICD-10-CM | POA: Diagnosis not present

## 2017-05-07 ENCOUNTER — Other Ambulatory Visit (HOSPITAL_COMMUNITY): Payer: Self-pay | Admitting: Otolaryngology

## 2017-05-07 DIAGNOSIS — R221 Localized swelling, mass and lump, neck: Secondary | ICD-10-CM

## 2017-05-08 ENCOUNTER — Other Ambulatory Visit: Payer: Self-pay | Admitting: Otolaryngology

## 2017-05-08 DIAGNOSIS — R221 Localized swelling, mass and lump, neck: Secondary | ICD-10-CM

## 2017-05-20 ENCOUNTER — Ambulatory Visit
Admission: RE | Admit: 2017-05-20 | Discharge: 2017-05-20 | Disposition: A | Discharge status: Home / Self Care | Payer: 59 | Source: Ambulatory Visit | Attending: Otolaryngology | Admitting: Otolaryngology

## 2017-05-20 DIAGNOSIS — R221 Localized swelling, mass and lump, neck: Secondary | ICD-10-CM | POA: Diagnosis not present

## 2017-05-20 MED ORDER — IOPAMIDOL (ISOVUE-300) INJECTION 61%
75.0000 mL | Freq: Once | INTRAVENOUS | Status: AC | PRN
  Administered 2017-05-20: 75 mL via INTRAVENOUS

## 2017-05-26 DIAGNOSIS — R221 Localized swelling, mass and lump, neck: Secondary | ICD-10-CM | POA: Diagnosis not present

## 2017-06-03 ENCOUNTER — Other Ambulatory Visit: Payer: Self-pay | Admitting: Otolaryngology

## 2017-06-03 DIAGNOSIS — R221 Localized swelling, mass and lump, neck: Secondary | ICD-10-CM

## 2017-06-08 ENCOUNTER — Ambulatory Visit
Admission: RE | Admit: 2017-06-08 | Discharge: 2017-06-08 | Disposition: A | Discharge status: Home / Self Care | Payer: 59 | Source: Ambulatory Visit | Attending: Otolaryngology | Admitting: Otolaryngology

## 2017-06-08 DIAGNOSIS — R221 Localized swelling, mass and lump, neck: Secondary | ICD-10-CM

## 2017-06-08 MED ORDER — GADOBENATE DIMEGLUMINE 529 MG/ML IV SOLN
13.0000 mL | Freq: Once | INTRAVENOUS | Status: AC | PRN
  Administered 2017-06-08: 13 mL via INTRAVENOUS

## 2017-07-08 DIAGNOSIS — R221 Localized swelling, mass and lump, neck: Secondary | ICD-10-CM | POA: Diagnosis not present

## 2017-08-27 ENCOUNTER — Other Ambulatory Visit: Payer: Self-pay | Admitting: Family

## 2017-08-28 MED FILL — PROPRANOLOL 10 MG TABLET: 10 | 2 days supply | Qty: 10 | Fill #0

## 2017-08-28 NOTE — Telephone Encounter (Signed)
Pt requesting refill on Propranolol.   Please advise

## 2017-11-26 DIAGNOSIS — D235 Other benign neoplasm of skin of trunk: Secondary | ICD-10-CM | POA: Diagnosis not present

## 2017-11-26 DIAGNOSIS — D225 Melanocytic nevi of trunk: Secondary | ICD-10-CM | POA: Diagnosis not present

## 2017-11-26 DIAGNOSIS — L919 Hypertrophic disorder of the skin, unspecified: Secondary | ICD-10-CM | POA: Diagnosis not present

## 2017-11-26 DIAGNOSIS — Z1283 Encounter for screening for malignant neoplasm of skin: Secondary | ICD-10-CM | POA: Diagnosis not present

## 2017-11-26 DIAGNOSIS — L820 Inflamed seborrheic keratosis: Secondary | ICD-10-CM | POA: Diagnosis not present

## 2017-12-31 ENCOUNTER — Other Ambulatory Visit: Payer: Self-pay | Admitting: Family

## 2017-12-31 DIAGNOSIS — Z1231 Encounter for screening mammogram for malignant neoplasm of breast: Secondary | ICD-10-CM

## 2018-01-11 DIAGNOSIS — R221 Localized swelling, mass and lump, neck: Secondary | ICD-10-CM | POA: Diagnosis not present

## 2018-02-22 ENCOUNTER — Ambulatory Visit (HOSPITAL_BASED_OUTPATIENT_CLINIC_OR_DEPARTMENT_OTHER)
Admission: RE | Admit: 2018-02-22 | Discharge: 2018-02-22 | Disposition: A | Discharge status: Home / Self Care | Payer: 59 | Source: Ambulatory Visit | Attending: Family | Admitting: Family

## 2018-02-22 DIAGNOSIS — Z1231 Encounter for screening mammogram for malignant neoplasm of breast: Secondary | ICD-10-CM | POA: Insufficient documentation

## 2018-03-01 MED FILL — TRETINOIN 0.025% CREAM: 0.025 | 30 days supply | Qty: 45 | Fill #0

## 2018-03-29 ENCOUNTER — Telehealth: Payer: Self-pay | Admitting: *Deleted

## 2018-03-29 ENCOUNTER — Ambulatory Visit (INDEPENDENT_AMBULATORY_CARE_PROVIDER_SITE_OTHER): Payer: 59 | Admitting: Family

## 2018-03-29 ENCOUNTER — Encounter: Payer: Self-pay | Admitting: Family

## 2018-03-29 VITALS — BP 129/74 | HR 72 | Temp 98.7°F | Resp 16 | Ht 66.0 in | Wt 142.0 lb

## 2018-03-29 DIAGNOSIS — Z0000 Encounter for general adult medical examination without abnormal findings: Secondary | ICD-10-CM

## 2018-03-29 LAB — CBC WITH DIFFERENTIAL/PLATELET
BASOS ABS: 0 10*3/uL (ref 0.0–0.1)
BASOS PCT: 0.5 % (ref 0.0–3.0)
Eosinophils Absolute: 0.1 10*3/uL (ref 0.0–0.7)
Eosinophils Relative: 0.9 % (ref 0.0–5.0)
HCT: 39.8 % (ref 36.0–46.0)
Hemoglobin: 13.7 g/dL (ref 12.0–15.0)
LYMPHS ABS: 1.4 10*3/uL (ref 0.7–4.0)
Lymphocytes Relative: 24.6 % (ref 12.0–46.0)
MCHC: 34.5 g/dL (ref 30.0–36.0)
MCV: 95.8 fl (ref 78.0–100.0)
MONO ABS: 0.5 10*3/uL (ref 0.1–1.0)
Monocytes Relative: 8.2 % (ref 3.0–12.0)
NEUTROS ABS: 3.7 10*3/uL (ref 1.4–7.7)
NEUTROS PCT: 65.8 % (ref 43.0–77.0)
PLATELETS: 236 10*3/uL (ref 150.0–400.0)
RBC: 4.15 Mil/uL (ref 3.87–5.11)
RDW: 12.2 % (ref 11.5–15.5)
WBC: 5.7 10*3/uL (ref 4.0–10.5)

## 2018-03-29 LAB — URINALYSIS, ROUTINE W REFLEX MICROSCOPIC
BILIRUBIN URINE: NEGATIVE
HGB URINE DIPSTICK: NEGATIVE
Ketones, ur: NEGATIVE
LEUKOCYTES UA: NEGATIVE
Nitrite: NEGATIVE
Specific Gravity, Urine: 1.015 (ref 1.000–1.030)
Total Protein, Urine: NEGATIVE
URINE GLUCOSE: NEGATIVE
UROBILINOGEN UA: 0.2 (ref 0.0–1.0)
pH: 6 (ref 5.0–8.0)

## 2018-03-29 NOTE — Addendum Note (Signed)
Addended by: Kelle Darting A on: 03/29/2018 11:07 AM   Modules accepted: Orders

## 2018-03-29 NOTE — Telephone Encounter (Signed)
Noted  

## 2018-03-29 NOTE — Patient Instructions (Signed)
Please complete lab work prior to leaving.   

## 2018-03-29 NOTE — Telephone Encounter (Signed)
Received call from [facility] to let us know that pt's gold top tube burst while in the centrifuge and they are unable to run the chemistries that were ordered on her today (bmet, hepatic function pnl, lipid pnl and TSH). Notified pt and scheduled lab appt for Wednesday at 8:30am. Labs reordered.

## 2018-03-29 NOTE — Progress Notes (Signed)
Subjective:    Patient ID: [name], female    DOB: 12/05/1975, 43 y.o.   MRN: [id number]  HPI  Patient presents today for complete physical.  Immunizations: flu up to date Diet: healthy Exercise:  5-6 days a week, does "burn bootcamp Pap Smear: 12/18/15 Mammogram:   Vision: last year Dental:  Up to date Wt Readings from Last 3 Encounters:  03/29/18 142 lb (64.4 kg)  03/20/17 144 lb (65.3 kg)  12/18/15 136 lb 6.4 oz (61.9 kg)      Review of Systems  Constitutional: Negative for unexpected weight change.  HENT: Negative for hearing loss and rhinorrhea.   Eyes: Negative for visual disturbance.  Respiratory: Negative for cough.   Cardiovascular: Negative for leg swelling.  Gastrointestinal: Negative for constipation and diarrhea.  Genitourinary: Negative for dysuria, frequency and hematuria.  Musculoskeletal:       Denies myalgia/arthralgia  Neurological: Negative for headaches.  Psychiatric/Behavioral:       Denies depression   Past Medical History:  Diagnosis Date  . Acute medial meniscus tear of right knee   . H/O pyelonephritis 2001 & 2009     Social History   Socioeconomic History  . Marital status: Married    Spouse name: Not on file  . Number of children: 2  . Years of education: Not on file  . Highest education level: Not on file  Occupational History  . 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 Financial resource strain: Not on file  . Food insecurity:    Worry: Not on file    Inability: Not on file  . Transportation needs:    Medical: Not on file    Non-medical: Not on file  Tobacco Use  . Smoking status: Never Smoker  . Smokeless tobacco: Never Used  Substance and Sexual Activity  . Alcohol use: Yes    Alcohol/week: 2.0 standard drinks    Types: 2 Glasses of wine per week   Comment: social  . Drug use: No  . Sexual activity: Yes    Birth control/protection: Condom  Lifestyle  . Physical activity:    Days per week: Not on file    Minutes per session: Not on file  . Stress: Not on file  Relationships  . Social connections:    Talks on phone: Not on file    Gets together: Not on file    Attends religious service: Not on file    Active member of club or organization: Not on file    Attends meetings of clubs or organizations: Not on file    Relationship status: Not on file  . Intimate partner violence:    Fear of current or ex partner: Not on file    Emotionally abused: Not on file    Physically abused: Not on file    Forced sexual activity: Not on file  Other Topics Concern  . 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     Past Surgical History:  Procedure Laterality Date  . APPENDECTOMY  age 38  . KNEE ARTHROSCOPY Right 06/08/2015   Procedure: RIGHT KNEE ARTHROSCOPY WITH DEBRIDEMENT;  Surgeon: Frederik Pear, MD;  Location: [place];  Service: Orthopedics;  Laterality: Right;    Family History  Problem Relation Age of Onset  . Hyperlipidemia Mother   . Hypertension Father   . Thyroid disease Father        hypethyroidism  . Hyperlipidemia Maternal Grandfather   . Hypertension Maternal Grandfather   . Heart attack Maternal Grandfather   . Cancer Maternal Grandfather        prostate  . Heart disease Maternal Grandfather   . Stroke Maternal Grandfather   . Hyperlipidemia Maternal Grandmother   . Cancer Maternal Grandmother        breast  . Diabetes Paternal Grandmother   . Arthritis Paternal Grandmother   . Heart attack Paternal Grandfather   . Asthma Neg Hx   . Alcohol abuse Neg Hx   . Drug abuse Neg Hx     Allergies  Allergen Reactions  . Other Swelling    "Lot of swelling and scar tissue  at injection site" "Lot of swelling and scar tissue at injection site"      t-dap vaccine "Lot of swelling and scar tissue at injection site"  . Tdap [Tetanus-Diphth-Acell Pertussis] Swelling    "Lot of swelling and scar tissue at injection site"    Current Outpatient Medications on File Prior to Visit  Medication Sig Dispense Refill  . propranolol (INDERAL) 10 MG tablet TAKE 4 TABLETS BY MOUTH 60-90 MINUTES PRIOR TO PRESENTATION 20 tablet 0  . valACYclovir (VALTREX) 1000 MG tablet TAKE 1 TABLET BY MOUTH ONCE DAILY (Patient not taking: Reported on 03/29/2018) 30 tablet 1   No current facility-administered medications on file prior to visit.     BP 129/74 (BP Location: Left Arm, Patient Position: Sitting, Cuff Size: Small)   Pulse 72   Temp 98.7 F (37.1 C) (Oral)   Resp 16   Ht 5\' 6"  (1.676 m)   Wt 142 lb (64.4 kg)   LMP 03/05/2018   SpO2 100%   BMI 22.92 kg/m       Objective:   Physical Exam Physical Exam  Constitutional: She is oriented to person, place, and time. She appears well-developed and well-nourished. No distress.  HENT:  Head: Normocephalic and atraumatic.  Right Ear: Tympanic membrane and ear canal normal.  Left Ear: Tympanic membrane and ear canal normal.  Mouth/Throat: Oropharynx is clear and moist.  Eyes: Pupils are equal, round, and reactive to light. No scleral icterus.  Neck: Normal range of motion. No thyromegaly present. 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   No murmur heard. Pulmonary/Chest: Effort normal and breath sounds normal. No respiratory distress. He has no wheezes. She has no rales. She exhibits no tenderness.  Abdominal: Soft. Bowel sounds are normal. She exhibits no distension and no mass. There is no tenderness. There is no rebound and no guarding.  Musculoskeletal: She exhibits no edema.  Lymphadenopathy:    She has no cervical adenopathy.  Neurological: She is alert and oriented to person, place, and time.She exhibits normal muscle tone. Coordination normal.  Skin: Skin is warm and dry.  Psychiatric: She has a normal mood and affect. Her behavior is normal. Judgment and thought content normal.  Breasts: Examined lying Right: Without masses, retractions, discharge or axillary adenopathy.  Left: Without masses, retractions, discharge or axillary adenopathy.           Assessment & Plan:   Preventative care- discussed continuing healthy  diet, exercise.  Obtain routine lab work mammogram and pap up to date.  She had reaction to tetanus so we have opted not to revaccinate at this time. EKG tracing is personally reviewed.  EKG notes NSR.  No acute changes.         Assessment & Plan:

## 2018-03-31 ENCOUNTER — Other Ambulatory Visit (INDEPENDENT_AMBULATORY_CARE_PROVIDER_SITE_OTHER): Payer: 59

## 2018-03-31 DIAGNOSIS — Z0000 Encounter for general adult medical examination without abnormal findings: Secondary | ICD-10-CM

## 2018-03-31 LAB — HEPATIC FUNCTION PANEL
ALBUMIN: 4.4 g/dL (ref 3.5–5.2)
ALT: 11 U/L (ref 0–35)
AST: 17 U/L (ref 0–37)
Alkaline Phosphatase: 40 U/L (ref 39–117)
BILIRUBIN TOTAL: 0.8 mg/dL (ref 0.2–1.2)
Bilirubin, Direct: 0.2 mg/dL (ref 0.0–0.3)
Total Protein: 6.8 g/dL (ref 6.0–8.3)

## 2018-03-31 LAB — BASIC METABOLIC PANEL
BUN: 21 mg/dL (ref 6–23)
CHLORIDE: 101 meq/L (ref 96–112)
CO2: 27 meq/L (ref 19–32)
CREATININE: 0.86 mg/dL (ref 0.40–1.20)
Calcium: 9.8 mg/dL (ref 8.4–10.5)
GFR: 76.39 mL/min (ref >60.00)
Glucose, Bld: 85 mg/dL (ref 70–99)
POTASSIUM: 4.3 meq/L (ref 3.5–5.1)
SODIUM: 135 meq/L (ref 135–145)

## 2018-03-31 LAB — TSH: TSH: 1.13 u[IU]/mL (ref 0.35–4.50)

## 2018-03-31 LAB — LIPID PANEL
Cholesterol: 196 mg/dL (ref 0–200)
HDL: 73.7 mg/dL (ref >39.00)
LDL CALC: 110 mg/dL — AB (ref 0–99)
NONHDL: 122.05
Total CHOL/HDL Ratio: 3
Triglycerides: 62 mg/dL (ref 0.0–149.0)
VLDL: 12.4 mg/dL (ref 0.0–40.0)

## 2018-04-25 DIAGNOSIS — J020 Streptococcal pharyngitis: Secondary | ICD-10-CM | POA: Diagnosis not present

## 2018-04-25 MED FILL — PENICILLIN VK 500 MG TABLET: 500 | 10 days supply | Qty: 20 | Fill #0

## 2018-11-11 IMAGING — US US SOFT TISSUE HEAD/NECK
1 series · 14 of 25 positions shown · non-contrast
Comparison: None.

CLINICAL DATA: Left anterior neck swelling x weeks, improving

EXAM:
ULTRASOUND OF HEAD/NECK SOFT TISSUES
TECHNIQUE: Ultrasound examination of the head and neck soft tissues was
performed in the area of clinical concern.

[Series 1: us soft tissue head/neck · 0.07mm/px · 14 of 33 slices shown]
[im 1/33]
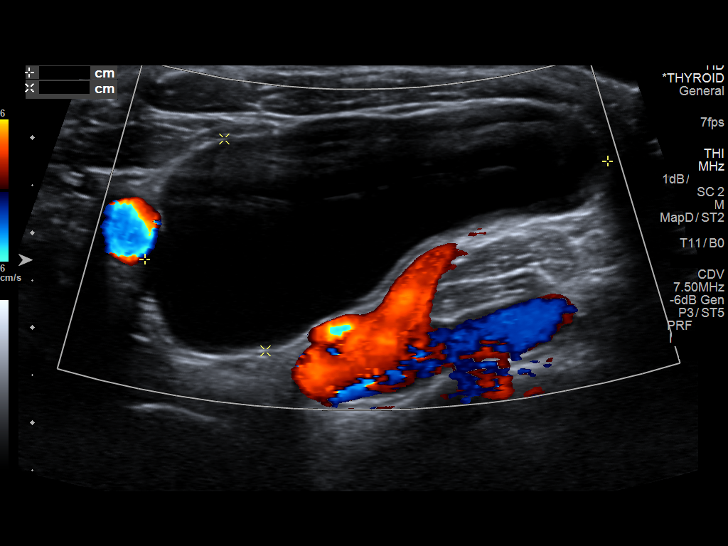
[im 3/33]
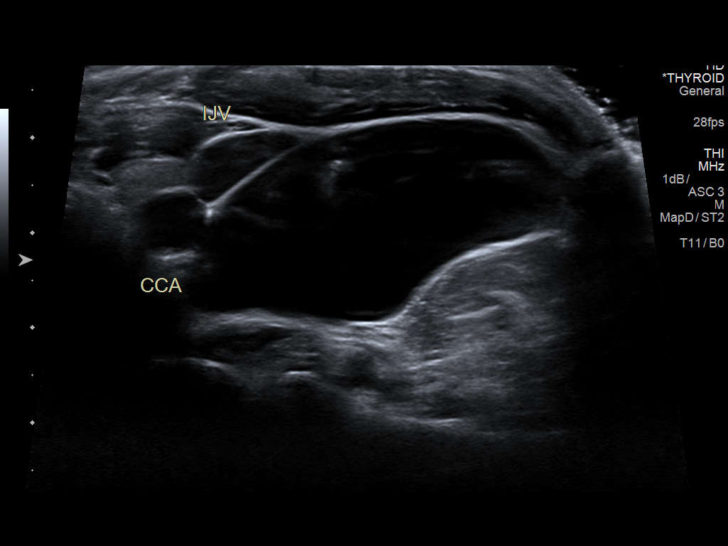
[im 6/33]
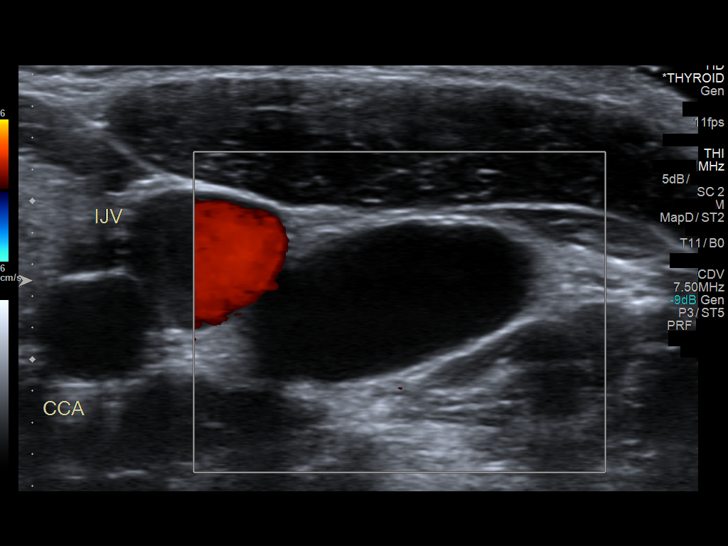
[im 9/33]
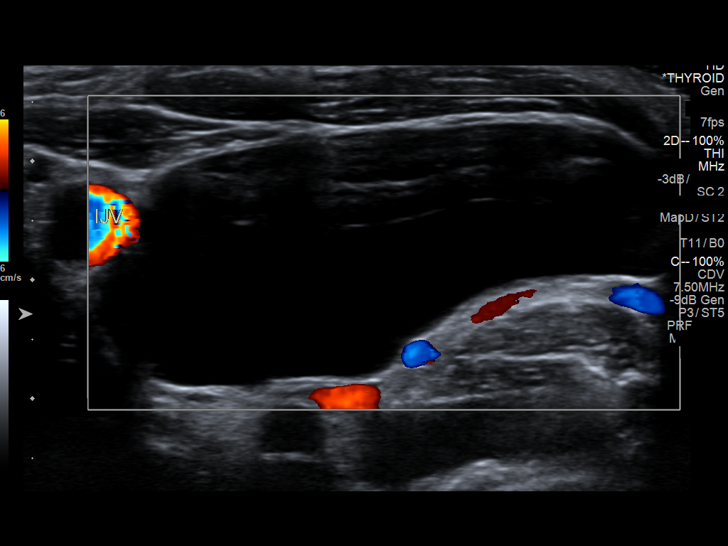
[im 11/33]
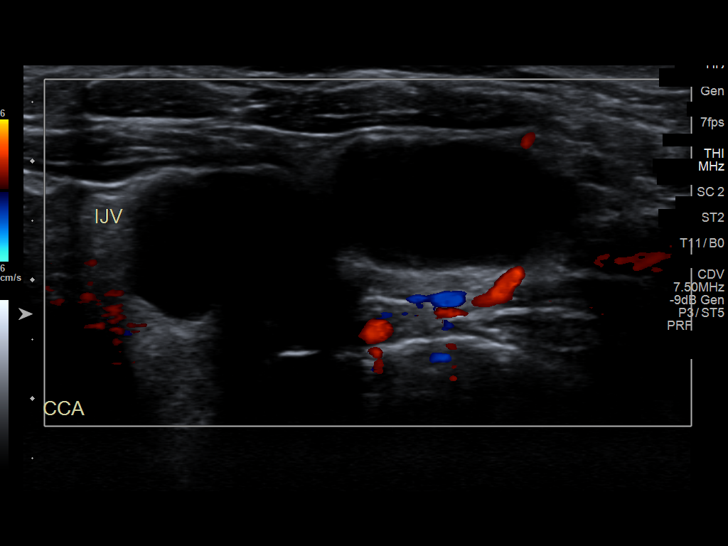
[im 13/33]
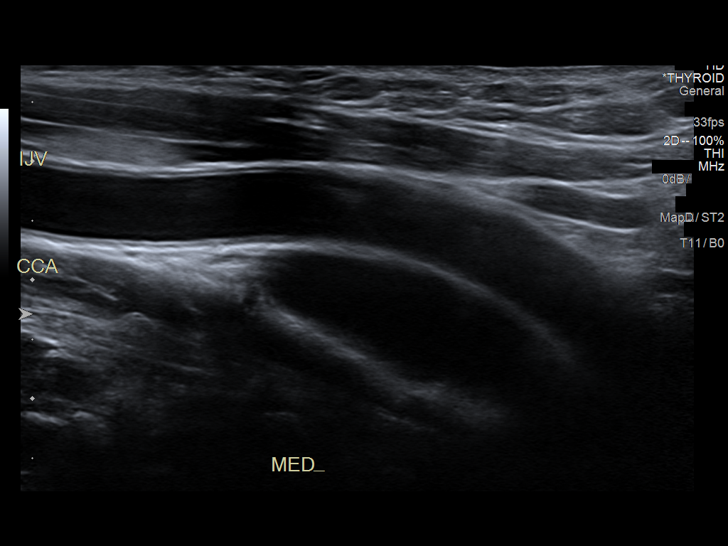
[im 15/33]
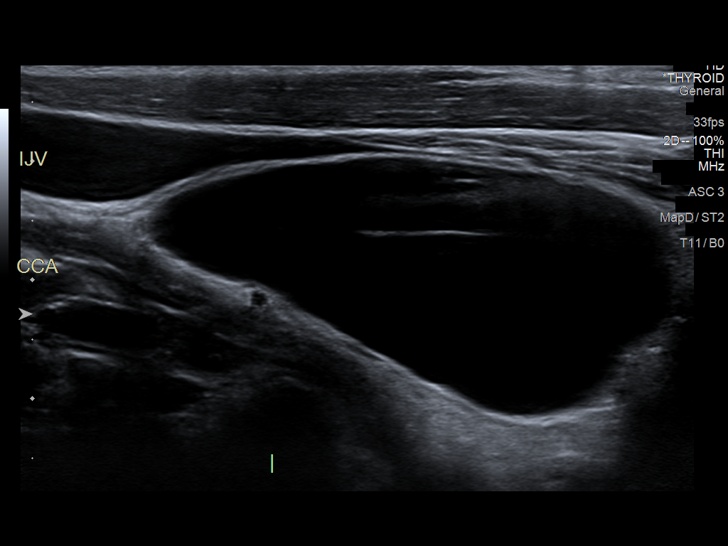
[im 18/33]
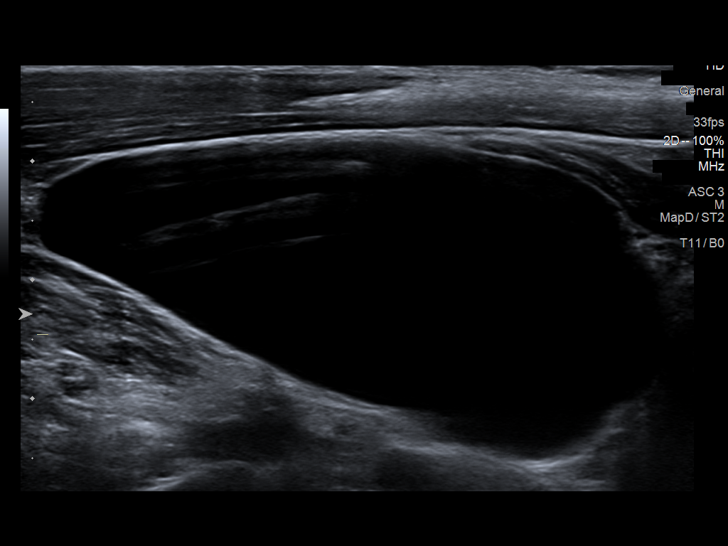
[im 21/33]
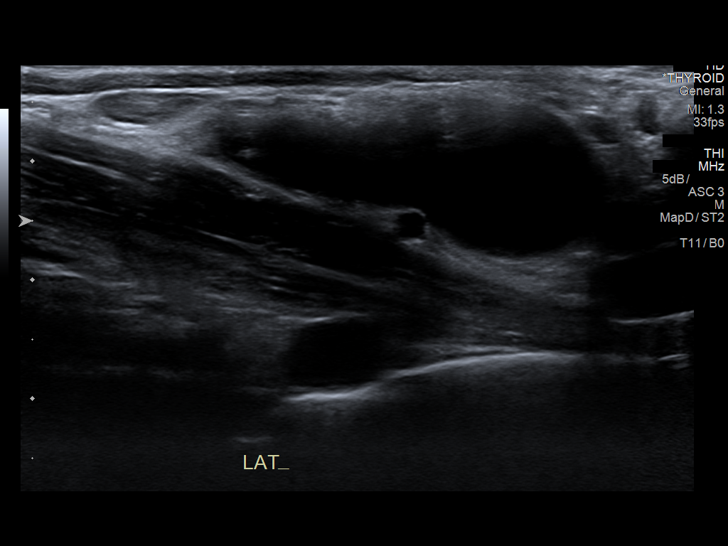
[im 22/33]
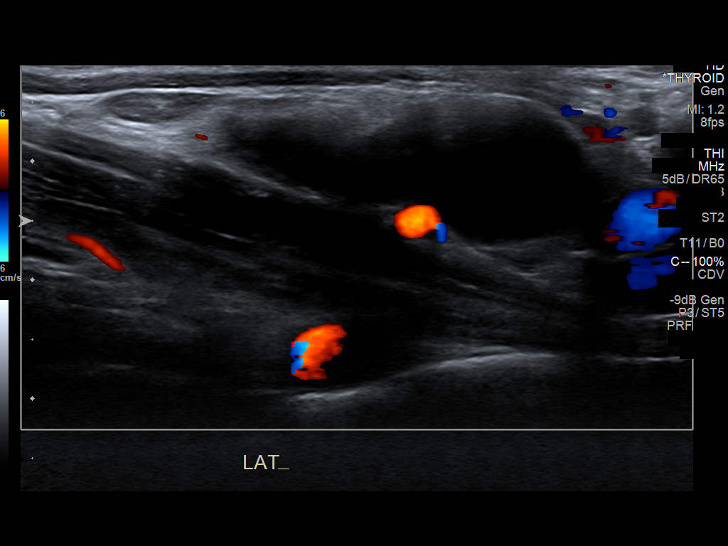
[im 25/33]
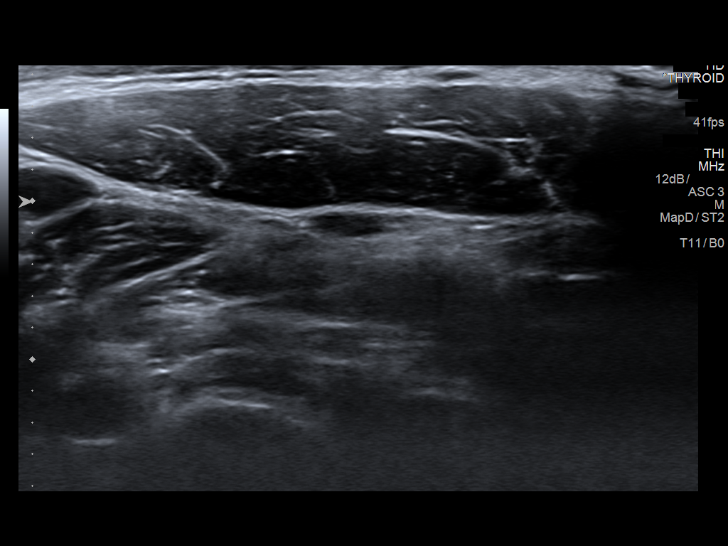
[im 27/33]
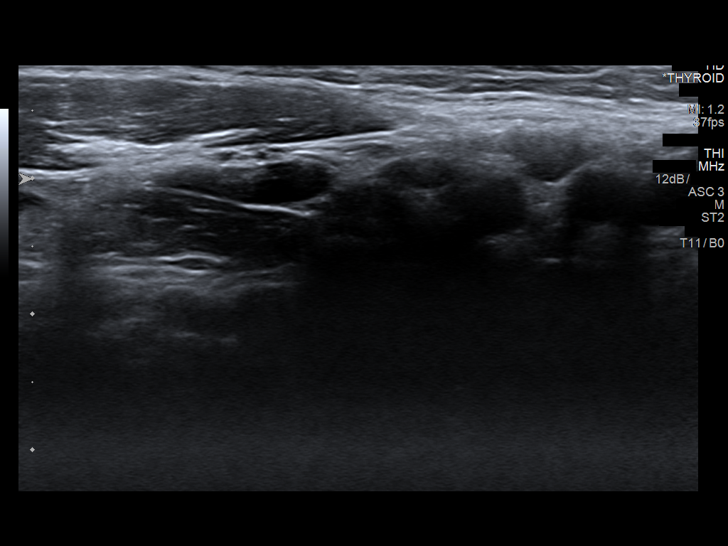
[im 30/33]
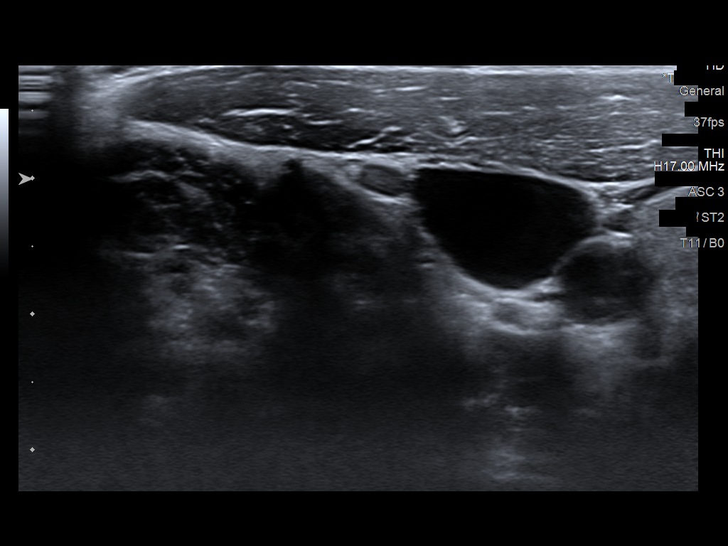
[im 33/33]
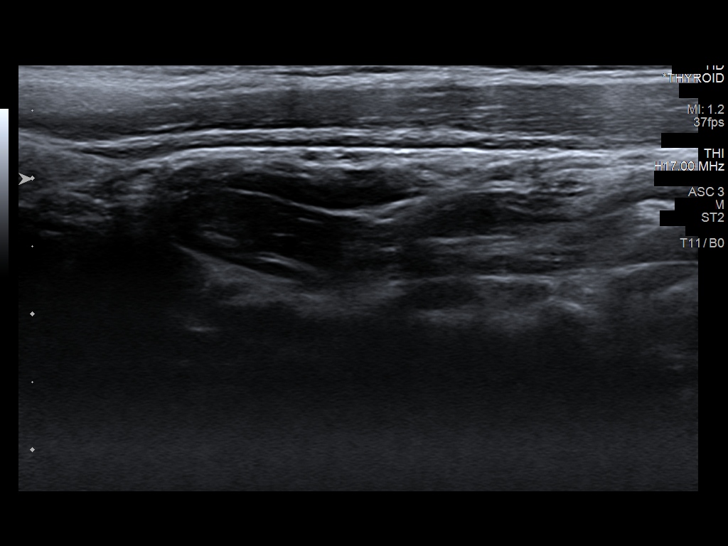

[14 of 25 positions shown; findings below may reference images not displayed]

FINDINGS: 5.3 x 5 x 2.3 cm simple appearing cystic lesion lateral to the
common carotid artery and left IJ vein, deep to the
sternocleidomastoid musculature, corresponding to region of concern.
No internal flow signal.. No definite internal septations or mural
nodularity. No associated soft tissue component. No regional
adenopathy localized.
IMPRESSION: 5.3 cm cystic left neck lesion. Favor second branchial cleft cyst,
much less likely dermoid or thyroid cyst. Recommend surgical
consultation.

## 2019-01-10 DIAGNOSIS — R221 Localized swelling, mass and lump, neck: Secondary | ICD-10-CM | POA: Diagnosis not present

## 2019-03-25 ENCOUNTER — Other Ambulatory Visit (HOSPITAL_BASED_OUTPATIENT_CLINIC_OR_DEPARTMENT_OTHER): Payer: Self-pay | Admitting: Family

## 2019-03-25 DIAGNOSIS — Z1231 Encounter for screening mammogram for malignant neoplasm of breast: Secondary | ICD-10-CM

## 2019-04-18 ENCOUNTER — Other Ambulatory Visit: Payer: Self-pay

## 2019-04-19 ENCOUNTER — Encounter: Payer: Self-pay | Admitting: Family

## 2019-04-19 ENCOUNTER — Other Ambulatory Visit: Payer: Self-pay | Admitting: Family

## 2019-04-19 ENCOUNTER — Other Ambulatory Visit (HOSPITAL_COMMUNITY)
Admission: RE | Admit: 2019-04-19 | Discharge: 2019-04-19 | Disposition: A | Discharge status: Home / Self Care | Payer: 59 | Source: Ambulatory Visit | Attending: Family | Admitting: Family

## 2019-04-19 ENCOUNTER — Other Ambulatory Visit: Payer: Self-pay

## 2019-04-19 ENCOUNTER — Ambulatory Visit (HOSPITAL_BASED_OUTPATIENT_CLINIC_OR_DEPARTMENT_OTHER)
Admission: RE | Admit: 2019-04-19 | Discharge: 2019-04-19 | Disposition: A | Discharge status: Home / Self Care | Payer: 59 | Source: Ambulatory Visit | Attending: Family | Admitting: Family

## 2019-04-19 ENCOUNTER — Ambulatory Visit (INDEPENDENT_AMBULATORY_CARE_PROVIDER_SITE_OTHER): Payer: 59 | Admitting: Family

## 2019-04-19 ENCOUNTER — Encounter (HOSPITAL_BASED_OUTPATIENT_CLINIC_OR_DEPARTMENT_OTHER): Payer: Self-pay

## 2019-04-19 VITALS — BP 132/80 | HR 70 | Temp 97.0°F | Resp 16 | Ht 66.5 in | Wt 142.0 lb

## 2019-04-19 DIAGNOSIS — Z0000 Encounter for general adult medical examination without abnormal findings: Secondary | ICD-10-CM | POA: Insufficient documentation

## 2019-04-19 DIAGNOSIS — Z1231 Encounter for screening mammogram for malignant neoplasm of breast: Secondary | ICD-10-CM | POA: Insufficient documentation

## 2019-04-19 LAB — CBC WITH DIFFERENTIAL/PLATELET
Basophils Absolute: 0 10*3/uL (ref 0.0–0.1)
Basophils Relative: 0.5 % (ref 0.0–3.0)
Eosinophils Absolute: 0 10*3/uL (ref 0.0–0.7)
Eosinophils Relative: 0.5 % (ref 0.0–5.0)
HCT: 40.8 % (ref 36.0–46.0)
Hemoglobin: 14 g/dL (ref 12.0–15.0)
Lymphocytes Relative: 21.6 % (ref 12.0–46.0)
Lymphs Abs: 1.3 10*3/uL (ref 0.7–4.0)
MCHC: 34.2 g/dL (ref 30.0–36.0)
MCV: 95.4 fl (ref 78.0–100.0)
Monocytes Absolute: 0.4 10*3/uL (ref 0.1–1.0)
Monocytes Relative: 7.4 % (ref 3.0–12.0)
Neutro Abs: 4.2 10*3/uL (ref 1.4–7.7)
Neutrophils Relative %: 70 % (ref 43.0–77.0)
Platelets: 239 10*3/uL (ref 150.0–400.0)
RBC: 4.28 Mil/uL (ref 3.87–5.11)
RDW: 12.3 % (ref 11.5–15.5)
WBC: 6 10*3/uL (ref 4.0–10.5)

## 2019-04-19 LAB — TSH: TSH: 1.17 u[IU]/mL (ref 0.35–4.50)

## 2019-04-19 LAB — BASIC METABOLIC PANEL
BUN: 18 mg/dL (ref 6–23)
CO2: 28 mEq/L (ref 19–32)
Calcium: 10.2 mg/dL (ref 8.4–10.5)
Chloride: 98 mEq/L (ref 96–112)
Creatinine, Ser: 0.9 mg/dL (ref 0.40–1.20)
GFR: 67.87 mL/min (ref >60.00)
Glucose, Bld: 95 mg/dL (ref 70–99)
Potassium: 4.4 mEq/L (ref 3.5–5.1)
Sodium: 135 mEq/L (ref 135–145)

## 2019-04-19 LAB — LIPID PANEL
Cholesterol: 190 mg/dL (ref 0–200)
HDL: 65.1 mg/dL (ref >39.00)
LDL Cholesterol: 110 mg/dL — ABNORMAL HIGH (ref 0–99)
NonHDL: 124.69
Total CHOL/HDL Ratio: 3
Triglycerides: 71 mg/dL (ref 0.0–149.0)
VLDL: 14.2 mg/dL (ref 0.0–40.0)

## 2019-04-19 LAB — HEPATIC FUNCTION PANEL
ALT: 14 U/L (ref 0–35)
AST: 20 U/L (ref 0–37)
Albumin: 4.5 g/dL (ref 3.5–5.2)
Alkaline Phosphatase: 49 U/L (ref 39–117)
Bilirubin, Direct: 0.1 mg/dL (ref 0.0–0.3)
Total Bilirubin: 0.6 mg/dL (ref 0.2–1.2)
Total Protein: 7.2 g/dL (ref 6.0–8.3)

## 2019-04-19 MED ORDER — PROPRANOLOL HCL 10 MG PO TABS: ORAL_TABLET | ORAL | 1 refills | Status: DC

## 2019-04-19 MED FILL — PROPRANOLOL 10 MG TABLET: 10 | 5 days supply | Qty: 20 | Fill #0

## 2019-04-19 NOTE — Patient Instructions (Addendum)
Please complete lab work prior to leaving. Keep up the great work with healthy diet and regular exercise.  

## 2019-04-19 NOTE — Progress Notes (Signed)
Subjective:    Patient ID: [name], female    DOB: 23-Jun-1975, 44 y.o.   MRN: [id number]  HPI  Patient presents today for complete physical.  Immunizations: 03/18/07 tetanus- but allergic, flu shot up to date, had Pfizer vaccine Diet: healthy Exercise: 5-6 days a week. Goes to burn boot camp Pap Smear: 12/18/15 Vision: 1.5 yrs ago Dental: up to date Mammogram: scheduled for today- due.  Wt Readings from Last 3 Encounters:  04/19/19 142 lb (64.4 kg)  03/29/18 142 lb (64.4 kg)  03/20/17 144 lb (65.3 kg)      Review of Systems  Constitutional: Negative for unexpected weight change.  HENT: Negative for hearing loss and rhinorrhea.   Eyes: Negative for visual disturbance.  Respiratory: Negative for cough and shortness of breath.   Cardiovascular: Negative for chest pain.  Gastrointestinal: Negative for blood in stool, constipation and diarrhea.  Genitourinary: Positive for menstrual problem (skipped period x 2 months, LMP 04/08/19, some hot flashes). Negative for dysuria, frequency and hematuria.  Musculoskeletal: Negative for arthralgias and myalgias.  Skin: Negative for rash.  Neurological: Negative for headaches.  Hematological: Negative for adenopathy.  Psychiatric/Behavioral:       Denies depression/anxiety       Past Medical History:  Diagnosis Date  . Acute medial meniscus tear of right knee   . H/O pyelonephritis 2001 & 2009     Social History   Socioeconomic History  . Marital status: Married    Spouse name: Not on file  . Number of children: 2  . Years of education: Not on file  . Highest education level: Not on file  Occupational History  . 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 Smoking status: Never Smoker  . Smokeless tobacco: Never Used  Substance and Sexual Activity  . Alcohol  use: Yes    Alcohol/week: 2.0 standard drinks    Types: 2 Glasses of wine per week    Comment: social  . Drug use: No  . Sexual activity: Yes    Birth control/protection: Condom  Other Topics Concern  . 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    Social Determinants of Health   Financial Resource Strain:   . Difficulty of Paying Living Expenses: Not on file  Food Insecurity:   . Worried About Charity fundraiser in the Last Year: Not on file  . Ran Out of Food in the Last Year: Not on file  Transportation Needs:   . Lack of Transportation (Medical): Not on file  . Lack of Transportation (Non-Medical): Not on file  Physical Activity:   . Days of Exercise per Week: Not on file  . Minutes of Exercise per Session: Not on file  Stress:   . Feeling of Stress : Not on file  Social Connections:   . Frequency of Communication with Friends and Family: Not on file  . Frequency of Social Gatherings with Friends and Family: Not on file  . Attends Religious Services: Not on file  . Active Member of Clubs or Organizations: Not on file  . Attends Archivist Meetings: Not on file  . Marital Status:  Not on file  Intimate Partner Violence:   . Fear of Current or Ex-Partner: Not on file  . Emotionally Abused: Not on file  . Physically Abused: Not on file  . Sexually Abused: Not on file    Past Surgical History:  Procedure Laterality Date  . APPENDECTOMY  age 27  . KNEE ARTHROSCOPY Right 06/08/2015   Procedure: RIGHT KNEE ARTHROSCOPY WITH DEBRIDEMENT;  Surgeon: Frederik Pear, MD;  Location: [place];  Service: Orthopedics;  Laterality: Right;    Family History  Problem Relation Age of Onset  . Hyperlipidemia Mother   . Hypertension Father   . Thyroid disease Father        hypethyroidism  . Hyperlipidemia Maternal Grandfather   .  Hypertension Maternal Grandfather   . Heart attack Maternal Grandfather   . Cancer Maternal Grandfather        prostate  . Heart disease Maternal Grandfather   . Stroke Maternal Grandfather   . Hyperlipidemia Maternal Grandmother   . Cancer Maternal Grandmother        breast  . Diabetes Paternal Grandmother   . Arthritis Paternal Grandmother   . Heart attack Paternal Grandfather   . Asthma Neg Hx   . Alcohol abuse Neg Hx   . Drug abuse Neg Hx     Allergies  Allergen Reactions  . Other Swelling    "Lot of swelling and scar tissue at injection site" "Lot of swelling and scar tissue at injection site"      t-dap vaccine "Lot of swelling and scar tissue at injection site"  . Tdap [Tetanus-Diphth-Acell Pertussis] Swelling    "Lot of swelling and scar tissue at injection site"    Current Outpatient Medications on File Prior to Visit  Medication Sig Dispense Refill  . propranolol (INDERAL) 10 MG tablet TAKE 4 TABLETS BY MOUTH 60-90 MINUTES PRIOR TO PRESENTATION 20 tablet 0  . valACYclovir (VALTREX) 1000 MG tablet TAKE 1 TABLET BY MOUTH ONCE DAILY (Patient not taking: Reported on 03/29/2018) 30 tablet 1   No current facility-administered medications on file prior to visit.    BP 132/80 (BP Location: Right Arm, Patient Position: Sitting, Cuff Size: Small)   Pulse 70   Temp (!) 97 F (36.1 C) (Temporal)   Resp 16   Ht 5' 6.5" (1.689 m)   Wt 142 lb (64.4 kg)   SpO2 100%   BMI 22.58 kg/m    Objective:   Physical Exam  Physical Exam  Constitutional: She is oriented to person, place, and time. She appears well-developed and well-nourished. No distress.  HENT:  Head: Normocephalic and atraumatic.  Right Ear: Tympanic membrane and ear canal normal.  Left Ear: Tympanic membrane and ear canal normal.  Mouth/Throat: not examined, pt wearing mask Eyes: Pupils are equal, round, and reactive to light. No scleral icterus.  Neck: Normal range of motion. No thyromegaly present.   Cardiovascular: Normal rate and regular rhythm.   No murmur heard. Pulmonary/Chest: Effort normal and breath sounds normal. No respiratory distress. He has no wheezes. She has no rales. She exhibits no tenderness.  Abdominal: Soft. Bowel sounds are normal. She exhibits no distension and no mass. There is no tenderness. There is no rebound and no guarding.  Musculoskeletal: She exhibits no edema.  Lymphadenopathy:    She has no cervical adenopathy.  Neurological: She is alert and oriented to person, place, and time. She has normal patellar reflexes. She exhibits normal muscle tone. Coordination normal.  Skin: Skin is  warm and dry.  Psychiatric: She has a normal mood and affect. Her behavior is normal. Judgment and thought content normal.  Breasts: Examined lying Right: Without masses, retractions, discharge or axillary adenopathy.  Left: Without masses, retractions, discharge or axillary adenopathy.  Inguinal/mons: Normal without inguinal adenopathy  External genitalia: Normal  BUS/Urethra/Skene's glands: Normal  Bladder: Normal  Vagina: Normal  Cervix: Normal (parous) some scarring noted from previous births Uterus: normal in size, shape and contour. Midline and mobile  Adnexa/parametria:  Rt: Without masses or tenderness.  Lt: Without masses or tenderness.  Anus and perineum: Normal            Assessment & Plan:  Preventative care- encouraged pt to continue healthy diet and regular exercise. Pap performed today. Flu and covid shots up to date. She will complete mammogram today. Dental up to date. She plans to schedule routine eye exam. Will obtain routine lab work.  This visit occurred during the SARS-CoV-2 public health emergency.  Safety protocols were in place, including screening questions prior to the visit, additional usage of staff PPE, and extensive cleaning of exam room while observing appropriate contact time as indicated for disinfecting solutions.          Assessment & Plan:

## 2019-04-20 LAB — CYTOLOGY - PAP
Comment: NEGATIVE
Diagnosis: NEGATIVE
High risk HPV: NEGATIVE

## 2019-12-29 ENCOUNTER — Other Ambulatory Visit (HOSPITAL_BASED_OUTPATIENT_CLINIC_OR_DEPARTMENT_OTHER): Payer: Self-pay | Admitting: Internal Medicine

## 2019-12-29 MED FILL — FLUARIX QUADRIVALENT 0.5 ML: 0.5 | 1 days supply | Qty: 1 | Fill #0

## 2020-02-22 DIAGNOSIS — H5213 Myopia, bilateral: Secondary | ICD-10-CM | POA: Diagnosis not present

## 2020-03-23 ENCOUNTER — Other Ambulatory Visit (HOSPITAL_BASED_OUTPATIENT_CLINIC_OR_DEPARTMENT_OTHER): Payer: Self-pay | Admitting: Internal Medicine

## 2020-03-23 ENCOUNTER — Ambulatory Visit: Payer: 59 | Attending: Internal Medicine

## 2020-03-23 DIAGNOSIS — Z23 Encounter for immunization: Secondary | ICD-10-CM

## 2020-03-23 NOTE — Progress Notes (Signed)
   Covid-19 Vaccination Clinic  Name:  JELENA MALICOAT    MRN: [id number] DOB: Nov 17, 1975  03/23/2020  Ms. Kochanski was observed post Covid-19 immunization for 15 minutes without incident. She was provided with Vaccine Information Sheet and instruction to access the V-Safe system.   Ms. Shook was instructed to call 911 with any severe reactions post vaccine: Marland Kitchen Difficulty breathing  . Swelling of face and throat  . A fast heartbeat  . A bad rash all over body  . Dizziness and weakness   Immunizations Administered    Name Date Dose VIS Date Route   Moderna Covid-19 Booster Vaccine 03/23/2020  9:43 AM 0.25 mL 01/04/2020 Intramuscular   Manufacturer: Levan Hurst   Lot: [id number]   [place]: 41962-229-79

## 2020-03-26 MED FILL — MODERNA COVID-19 VACCINE 10: 100 | 28 days supply | Qty: 0 | Fill #0

## 2020-04-10 MED FILL — PROPRANOLOL 10 MG TABLET: 10 | 5 days supply | Qty: 20 | Fill #1

## 2020-04-11 ENCOUNTER — Other Ambulatory Visit: Payer: Self-pay | Admitting: Family

## 2020-04-16 DIAGNOSIS — R221 Localized swelling, mass and lump, neck: Secondary | ICD-10-CM | POA: Diagnosis not present

## 2020-04-18 ENCOUNTER — Other Ambulatory Visit: Payer: Self-pay | Admitting: Internal Medicine

## 2020-05-14 ENCOUNTER — Other Ambulatory Visit (HOSPITAL_COMMUNITY): Payer: Self-pay | Admitting: Pharmacist

## 2020-05-14 MED FILL — CARESTART COVID-19 HOME TES: 4 days supply | Qty: 4 | Fill #0

## 2020-07-09 ENCOUNTER — Other Ambulatory Visit (HOSPITAL_COMMUNITY): Payer: Self-pay

## 2020-07-13 ENCOUNTER — Other Ambulatory Visit (HOSPITAL_BASED_OUTPATIENT_CLINIC_OR_DEPARTMENT_OTHER): Payer: Self-pay | Admitting: Family

## 2020-07-13 DIAGNOSIS — Z1231 Encounter for screening mammogram for malignant neoplasm of breast: Secondary | ICD-10-CM

## 2020-07-17 ENCOUNTER — Ambulatory Visit (HOSPITAL_BASED_OUTPATIENT_CLINIC_OR_DEPARTMENT_OTHER)
Admission: RE | Admit: 2020-07-17 | Discharge: 2020-07-17 | Disposition: A | Discharge status: Home / Self Care | Payer: 59 | Source: Ambulatory Visit | Attending: Family | Admitting: Family

## 2020-07-17 ENCOUNTER — Encounter (HOSPITAL_BASED_OUTPATIENT_CLINIC_OR_DEPARTMENT_OTHER): Payer: Self-pay

## 2020-07-17 ENCOUNTER — Other Ambulatory Visit: Payer: Self-pay

## 2020-07-17 DIAGNOSIS — Z1231 Encounter for screening mammogram for malignant neoplasm of breast: Secondary | ICD-10-CM | POA: Diagnosis not present

## 2020-07-30 ENCOUNTER — Other Ambulatory Visit (HOSPITAL_BASED_OUTPATIENT_CLINIC_OR_DEPARTMENT_OTHER): Payer: Self-pay

## 2020-07-30 MED ORDER — CARESTART COVID-19 HOME TEST VI KIT
PACK | 0 refills | Status: DC
  Filled 2020-07-30: qty 4, 4d supply, fill #0

## 2020-08-22 ENCOUNTER — Other Ambulatory Visit (HOSPITAL_BASED_OUTPATIENT_CLINIC_OR_DEPARTMENT_OTHER): Payer: Self-pay

## 2020-08-27 ENCOUNTER — Other Ambulatory Visit (HOSPITAL_BASED_OUTPATIENT_CLINIC_OR_DEPARTMENT_OTHER): Payer: Self-pay

## 2020-08-27 MED ORDER — CARESTART COVID-19 HOME TEST VI KIT
PACK | 0 refills | Status: DC
  Filled 2020-08-27: qty 14, 28d supply, fill #0

## 2020-08-27 MED ORDER — CARESTART COVID-19 HOME TEST VI KIT
PACK | 0 refills | Status: DC
  Filled 2020-08-27: qty 2, 4d supply, fill #0

## 2020-11-07 ENCOUNTER — Emergency Department (INDEPENDENT_AMBULATORY_CARE_PROVIDER_SITE_OTHER)
Admission: RE | Admit: 2020-11-07 | Discharge: 2020-11-07 | Disposition: A | Discharge status: Home / Self Care | Payer: 59 | Source: Ambulatory Visit | Attending: Family Medicine | Admitting: Family Medicine

## 2020-11-07 ENCOUNTER — Other Ambulatory Visit: Payer: Self-pay

## 2020-11-07 ENCOUNTER — Emergency Department (INDEPENDENT_AMBULATORY_CARE_PROVIDER_SITE_OTHER): Payer: 59

## 2020-11-07 VITALS — BP 134/80 | HR 69 | Temp 98.8°F | Resp 18 | Ht 65.0 in | Wt 136.0 lb

## 2020-11-07 DIAGNOSIS — M25572 Pain in left ankle and joints of left foot: Secondary | ICD-10-CM

## 2020-11-07 DIAGNOSIS — S93492A Sprain of other ligament of left ankle, initial encounter: Secondary | ICD-10-CM | POA: Diagnosis not present

## 2020-11-07 DIAGNOSIS — M25472 Effusion, left ankle: Secondary | ICD-10-CM

## 2020-11-07 DIAGNOSIS — R609 Edema, unspecified: Secondary | ICD-10-CM | POA: Diagnosis not present

## 2020-11-07 DIAGNOSIS — R600 Localized edema: Secondary | ICD-10-CM | POA: Diagnosis not present

## 2020-11-07 DIAGNOSIS — M7989 Other specified soft tissue disorders: Secondary | ICD-10-CM | POA: Diagnosis not present

## 2020-11-07 NOTE — ED Provider Notes (Signed)
[facility]    CSN: [id number] Arrival date & time: 11/07/20  1046      History   Chief Complaint Chief Complaint  Patient presents with   Ankle Pain    Left side      HPI [name] is a 45 y.o. female.   HPI  Patient states that she was doing a physically demanding work at this morning and twisted her left ankle.  Felt an immediate pop.  Swollen and discolored.  She immediately got off her ankle, took anti-inflammatories and used ice.  In spite of this it is quite swollen.  Pain with weightbearing.  Pain with inversion. Patient states she has had multiple sports injuries in the past.  She has fractured one of her ankles but does not recall which one  Past Medical History:  Diagnosis Date   Acute medial meniscus tear of right knee    H/O pyelonephritis 2001 & 2009    Patient Active Problem List   Diagnosis Date Noted   Right knee injury 03/06/2015   Left shoulder pain 01/11/2015   Preventative health care 11/13/2014   Lower back injury 03/18/2012    Past Surgical History:  Procedure Laterality Date   APPENDECTOMY  age 69   KNEE ARTHROSCOPY Right 06/08/2015   Procedure: RIGHT KNEE ARTHROSCOPY WITH DEBRIDEMENT;  Surgeon: Frederik Pear, MD;  Location: [place];  Service: Orthopedics;  Laterality: Right;    OB History     Gravida  2   Para  1   Term  1   Preterm  0   AB  0   Living  1      SAB  0   IAB  0   Ectopic      Multiple      Live Births               Home Medications    Prior to Admission medications   Medication Sig Start Date End Date Taking? Authorizing Provider  COVID-19 At Home Antigen Test [facility] COVID-19 HOME TEST) KIT TEST 08/27/20   Clementeen Graham, [facility]  COVID-19 At Home Antigen Test [facility] COVID-19 HOME TEST) KIT test 08/27/20   Clementeen Graham, [facility]  COVID-19 At Home Antigen Test KIT USE AS DIRECTED 05/14/20 05/14/21  Clementeen Graham, RPH  COVID-19 mRNA vaccine, Moderna, 100  MCG/0.5ML injection INJECT AS DIRECTED 03/23/20 03/23/21  Carlyle Basques, MD  influenza vac split quadrivalent PF (FLUARIX) 0.5 ML injection TAKE AS DIRECTED 12/29/19 12/28/20  Carlyle Basques, MD    Family History Family History  Problem Relation Age of Onset   Hyperlipidemia Mother    Heart attack Father    Hypertension Father    Thyroid disease Father        hypethyroidism   Hyperlipidemia Maternal Grandmother    Cancer Maternal Grandmother        breast   Hyperlipidemia Maternal Grandfather    Hypertension Maternal Grandfather    Heart attack Maternal Grandfather    Cancer Maternal Grandfather        prostate   Heart disease Maternal Grandfather    Stroke Maternal Grandfather    Diabetes Paternal Grandmother    Arthritis Paternal Grandmother    Heart attack Paternal Grandfather    Asthma Neg Hx    Alcohol abuse Neg Hx    Drug abuse Neg Hx     Social History Social History   Tobacco Use   Smoking status: Never  Smokeless tobacco: Never  Substance Use Topics   Alcohol use: Yes    Alcohol/week: 2.0 standard drinks    Types: 2 Glasses of wine per week    Comment: social   Drug use: No     Allergies   Other and Tdap [tetanus-diphth-acell pertussis]   Review of Systems Review of Systems See HPI  Physical Exam Triage Vital Signs ED Triage Vitals  Enc Vitals Group     BP 11/07/20 1105 134/80     Pulse Rate 11/07/20 1105 69     Resp 11/07/20 1105 18     Temp 11/07/20 1105 98.8 F (37.1 C)     Temp Source 11/07/20 1105 Oral     SpO2 11/07/20 1105 98 %     Weight 11/07/20 1102 136 lb (61.7 kg)     Height 11/07/20 1102 $RemoveBefor'5\' 5"'bdPyIegyEuwq$  (1.651 m)     Head Circumference --      Peak Flow --      Pain Score 11/07/20 1101 2     Pain Loc --      Pain Edu? --      Excl. in [place]? --    No data found.  Updated Vital Signs BP 134/80 (BP Location: Left Arm)   Pulse 69   Temp 98.8 F (37.1 C) (Oral)   Resp 18   Ht $R'5\' 5"'Oh$  (1.651 m)   Wt 61.7 kg   LMP 11/01/2020   SpO2  98%   BMI 22.63 kg/m      Physical Exam Constitutional:      General: She is not in acute distress.    Appearance: She is well-developed.  HENT:     Head: Normocephalic and atraumatic.     Mouth/Throat:     Comments: Mask is in place Eyes:     Conjunctiva/sclera: Conjunctivae normal.     Pupils: Pupils are equal, round, and reactive to light.  Cardiovascular:     Rate and Rhythm: Normal rate.  Pulmonary:     Effort: Pulmonary effort is normal. No respiratory distress.  Abdominal:     General: There is no distension.     Palpations: Abdomen is soft.  Musculoskeletal:        General: Swelling and signs of injury present.     Cervical back: Normal range of motion.     Left ankle: Swelling present. No deformity or ecchymosis. Tenderness present over the lateral malleolus. Decreased range of motion. Anterior drawer test positive.     Comments: Questionable positive anterior drawer testing on the left ankle.  Concern for ligament instability  Skin:    General: Skin is warm and dry.  Neurological:     Mental Status: She is alert.     Gait: Gait abnormal.     UC Treatments / Results  Labs (all labs ordered are listed, but only abnormal results are displayed) Labs Reviewed - No data to display  EKG   Radiology DG Ankle Complete Left  Result Date: 11/07/2020 CLINICAL DATA:  Rolled ankle this morning, lateral foot and ankle pain and swelling EXAM: LEFT ANKLE COMPLETE - 3+ VIEW; LEFT FOOT - COMPLETE 3+ VIEW COMPARISON:  None. FINDINGS: There is no definite evidence of acute fracture, dislocation, or joint effusion. There appears to be a corticated, nonacute fracture of the tip of the left lateral malleolus. There is no evidence of arthropathy or other focal bone abnormality. Soft tissue edema over the lateral ankle. IMPRESSION: 1. No definite acute fracture or dislocation of  the left foot or ankle. 2. There appears to be a corticated, nonacute fracture of the tip of the left  lateral malleolus. CT or MRI may be used to assess for fracture acuity and marrow edema if desired. 3. Soft tissue edema over the lateral ankle. Electronically Signed   By: Eddie Candle M.D.   On: 11/07/2020 12:01   DG Foot Complete Left  Result Date: 11/07/2020 CLINICAL DATA:  Rolled ankle this morning, lateral foot and ankle pain and swelling EXAM: LEFT ANKLE COMPLETE - 3+ VIEW; LEFT FOOT - COMPLETE 3+ VIEW COMPARISON:  None. FINDINGS: There is no definite evidence of acute fracture, dislocation, or joint effusion. There appears to be a corticated, nonacute fracture of the tip of the left lateral malleolus. There is no evidence of arthropathy or other focal bone abnormality. Soft tissue edema over the lateral ankle. IMPRESSION: 1. No definite acute fracture or dislocation of the left foot or ankle. 2. There appears to be a corticated, nonacute fracture of the tip of the left lateral malleolus. CT or MRI may be used to assess for fracture acuity and marrow edema if desired. 3. Soft tissue edema over the lateral ankle. Electronically Signed   By: Eddie Candle M.D.   On: 11/07/2020 12:01    Procedures Procedures (including critical care time)  Medications Ordered in UC Medications - No data to display  Initial Impression / Assessment and Plan / UC Course  I have reviewed the triage vital signs and the nursing notes.  Pertinent labs & imaging results that were available during my care of the patient were reviewed by me and considered in my medical decision making (see chart for details).     Because of a history of repeated strains, old fracture, and a questionable unstable ligament exam, I believe future.  Follow-up should be with sports medicine Final Clinical Impressions(s) / UC Diagnoses   Final diagnoses:  Sprain of anterior talofibular ligament of left ankle, initial encounter     Discharge Instructions      Ice, elevate, take anti inflammatory medication as needed See sports med  in follow up     ED Prescriptions   None    PDMP not reviewed this encounter.   Raylene Everts, MD 11/07/20 1300

## 2020-11-07 NOTE — ED Notes (Signed)
Pt returned to room  

## 2020-11-07 NOTE — Discharge Instructions (Addendum)
Ice, elevate, take anti inflammatory medication as needed See sports med in follow up

## 2020-11-07 NOTE — ED Triage Notes (Addendum)
Pt st she was at burn boot [employer] this morning and rolled her ankle, pt went home took ibuprofen, elevated and iced it and it began to swell.pt st her toes went numb at first but have returned back to normal  Pt st about 10 years ago she broke one of her ankles but she is not sure which one.

## 2020-11-07 NOTE — ED Notes (Signed)
Pt transported to to xray via wheelchair

## 2020-11-08 ENCOUNTER — Ambulatory Visit (INDEPENDENT_AMBULATORY_CARE_PROVIDER_SITE_OTHER): Payer: 59 | Admitting: Sports Medicine

## 2020-11-08 DIAGNOSIS — S99912A Unspecified injury of left ankle, initial encounter: Secondary | ICD-10-CM

## 2020-11-08 NOTE — Progress Notes (Signed)
    Procedures performed today:    None.  Independent interpretation of notes and tests performed by another provider:   X-rays personally reviewed, she does have what appears to be an old corticated avulsion from the tip of the lateral malleolus, it is difficult to tell if there is a acute on chronic injury here based on the x-rays.  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 She was seen in urgent care where x-rays showed what looks to be an old fracture through the tip of her lateral malleolus. On exam she is significantly bruised, swollen, she has significant tenderness over the fibula and the tip of the lateral malleolus itself. She is actually less tender over the ATFL, I tend to think that the injury is a true acute fracture. We did discuss the options of CT scan for confirmation versus treating it as if it was a fracture. We are going to treat as if it is a fracture, boot, she will consider partial weightbearing with crutches, ibuprofen 800 3 times daily. Return to see me in 4 weeks, in 4 weeks a sprain should be for the most part better, and a fracture will still be hurting, if she is nontender over the fracture at the 4-week follow-up we will transition into an ASO. Conditioning exercises given.    ___________________________________________ Gwen Her. Dianah Field, M.D., ABFM., CAQSM. Primary Care and [place] Instructor of [place] of [facility] of Medicine

## 2020-11-08 NOTE — Assessment & Plan Note (Signed)
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 She was seen in urgent care where x-rays showed what looks to be an old fracture through the tip of her lateral malleolus. On exam she is significantly bruised, swollen, she has significant tenderness over the fibula and the tip of the lateral malleolus itself. She is actually less tender over the ATFL, I tend to think that the injury is a true acute fracture. We did discuss the options of CT scan for confirmation versus treating it as if it was a fracture. We are going to treat as if it is a fracture, boot, she will consider partial weightbearing with crutches, ibuprofen 800 3 times daily. Return to see me in 4 weeks, in 4 weeks a sprain should be for the most part better, and a fracture will still be hurting, if she is nontender over the fracture at the 4-week follow-up we will transition into an ASO. Conditioning exercises given.

## 2020-11-14 ENCOUNTER — Other Ambulatory Visit (HOSPITAL_BASED_OUTPATIENT_CLINIC_OR_DEPARTMENT_OTHER): Payer: Self-pay

## 2020-11-27 ENCOUNTER — Other Ambulatory Visit (HOSPITAL_BASED_OUTPATIENT_CLINIC_OR_DEPARTMENT_OTHER): Payer: Self-pay

## 2020-12-06 ENCOUNTER — Ambulatory Visit (INDEPENDENT_AMBULATORY_CARE_PROVIDER_SITE_OTHER): Payer: 59 | Admitting: Sports Medicine

## 2020-12-06 ENCOUNTER — Other Ambulatory Visit: Payer: Self-pay

## 2020-12-06 DIAGNOSIS — S99912D Unspecified injury of left ankle, subsequent encounter: Secondary | ICD-10-CM

## 2020-12-06 NOTE — Progress Notes (Signed)
    Procedures performed today:    None.  Independent interpretation of notes and tests performed by another provider:   None.  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 X-rays showed an old fracture through the tip of the lateral malleolus but nothing obviously new, ankle was bruised and swollen with tenderness over the fibula and the tip of the lateral malleolus itself, she has done really well over the last month in a boot, improved considerably, still has some swelling, tenderness today is predominantly over the ATFL, ankle is stable. Continue NSAIDs as needed and transitioning into an ASO, she will do some ankle conditioning exercises every day.  I have recommended that she wear her ASO when working out for the rest of the year. Return to see me in 1 month.    ___________________________________________ Gwen Her. Dianah Field, M.D., ABFM., CAQSM. Primary Care and [place] Instructor of [place] of [facility] of Medicine

## 2020-12-06 NOTE — Assessment & Plan Note (Addendum)
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 X-rays showed an old fracture through the tip of the lateral malleolus but nothing obviously new, ankle was bruised and swollen with tenderness over the fibula and the tip of the lateral malleolus itself, she has done really well over the last month in a boot, improved considerably, still has some swelling, tenderness today is predominantly over the ATFL, ankle is stable. Continue NSAIDs as needed and transitioning into an ASO, she will do some ankle conditioning exercises every day.  I have recommended that she wear her ASO when working out for the rest of the year. Return to see me in 1 month.

## 2020-12-31 ENCOUNTER — Other Ambulatory Visit (HOSPITAL_BASED_OUTPATIENT_CLINIC_OR_DEPARTMENT_OTHER): Payer: Self-pay

## 2021-01-02 ENCOUNTER — Other Ambulatory Visit (HOSPITAL_BASED_OUTPATIENT_CLINIC_OR_DEPARTMENT_OTHER): Payer: Self-pay

## 2021-01-02 MED ORDER — INFLUENZA VAC SPLIT QUAD 0.5 ML IM SUSY
PREFILLED_SYRINGE | INTRAMUSCULAR | 0 refills | Status: DC
  Filled 2021-01-02: qty 0.5, 1d supply, fill #0

## 2021-01-03 ENCOUNTER — Ambulatory Visit (INDEPENDENT_AMBULATORY_CARE_PROVIDER_SITE_OTHER): Payer: 59 | Admitting: Sports Medicine

## 2021-01-03 DIAGNOSIS — S99912D Unspecified injury of left ankle, subsequent encounter: Secondary | ICD-10-CM

## 2021-01-03 DIAGNOSIS — L821 Other seborrheic keratosis: Secondary | ICD-10-CM | POA: Insufficient documentation

## 2021-01-03 NOTE — Assessment & Plan Note (Signed)
Cryotherapy left temporal SK, return in a month as needed.

## 2021-01-03 NOTE — Progress Notes (Signed)
    Procedures performed today:    Procedure:  Cryodestruction of left temporal SK Consent obtained and verified. Time-out conducted. Noted no overlying erythema, induration, or other signs of local infection. Completed without difficulty using Cryo-Gun. Advised to call if fevers/chills, erythema, induration, drainage, or persistent bleeding.  Independent interpretation of notes and tests performed by another provider:   None.  Brief History, Exam, Impression, and Recommendations:    Left ankle injury Essentially resolved, return as needed.  Seborrheic keratosis Cryotherapy left temporal SK, return in a month as needed.    ___________________________________________ Gwen Her. Dianah Field, M.D., ABFM., CAQSM. Primary Care and [place] Instructor of [place] of [facility] of Medicine

## 2021-01-03 NOTE — Assessment & Plan Note (Signed)
Essentially resolved, return as needed.

## 2021-01-31 ENCOUNTER — Ambulatory Visit: Payer: 59 | Admitting: Sports Medicine

## 2021-02-05 ENCOUNTER — Other Ambulatory Visit (HOSPITAL_BASED_OUTPATIENT_CLINIC_OR_DEPARTMENT_OTHER): Payer: Self-pay

## 2021-02-05 ENCOUNTER — Other Ambulatory Visit (HOSPITAL_COMMUNITY): Payer: Self-pay

## 2021-05-06 ENCOUNTER — Other Ambulatory Visit (HOSPITAL_BASED_OUTPATIENT_CLINIC_OR_DEPARTMENT_OTHER): Payer: Self-pay

## 2021-05-06 MED ORDER — CARESTART COVID-19 HOME TEST VI KIT
PACK | 0 refills | Status: DC
  Filled 2021-05-06: qty 4, 4d supply, fill #0

## 2021-05-20 ENCOUNTER — Encounter: Payer: Self-pay | Admitting: Sports Medicine

## 2021-06-04 ENCOUNTER — Other Ambulatory Visit: Payer: Self-pay

## 2021-06-05 ENCOUNTER — Encounter: Payer: Self-pay | Admitting: Gastroenterology

## 2021-06-05 ENCOUNTER — Other Ambulatory Visit (HOSPITAL_BASED_OUTPATIENT_CLINIC_OR_DEPARTMENT_OTHER): Payer: Self-pay

## 2021-06-05 ENCOUNTER — Ambulatory Visit (INDEPENDENT_AMBULATORY_CARE_PROVIDER_SITE_OTHER): Payer: 59 | Admitting: Family

## 2021-06-05 ENCOUNTER — Encounter: Payer: Self-pay | Admitting: Family

## 2021-06-05 VITALS — BP 150/78 | HR 68 | Temp 97.8°F | Resp 16 | Wt 133.4 lb

## 2021-06-05 DIAGNOSIS — R030 Elevated blood-pressure reading, without diagnosis of hypertension: Secondary | ICD-10-CM

## 2021-06-05 DIAGNOSIS — Z0000 Encounter for general adult medical examination without abnormal findings: Secondary | ICD-10-CM | POA: Diagnosis not present

## 2021-06-05 DIAGNOSIS — Z1211 Encounter for screening for malignant neoplasm of colon: Secondary | ICD-10-CM

## 2021-06-05 LAB — LIPID PANEL
Cholesterol: 213 mg/dL — ABNORMAL HIGH (ref 0–200)
HDL: 72 mg/dL (ref >39.00)
LDL Cholesterol: 131 mg/dL — ABNORMAL HIGH (ref 0–99)
NonHDL: 140.79
Total CHOL/HDL Ratio: 3
Triglycerides: 51 mg/dL (ref 0.0–149.0)
VLDL: 10.2 mg/dL (ref 0.0–40.0)

## 2021-06-05 MED ORDER — OMRON 3 SERIES BP MONITOR DEVI
0 refills | Status: AC
  Filled 2021-06-05: qty 1, 30d supply, fill #0

## 2021-06-05 NOTE — Assessment & Plan Note (Signed)
Declines tetanus due to prior reaction. Declines covid bivalent booster. Flu shot up to date. Refer for mammo and colo. Pap up to date.  ?

## 2021-06-05 NOTE — Assessment & Plan Note (Signed)
Has family hx of HTN. Will plan to repeat in 2 weeks. If still elevated will plan to start antihypertensive. She is already following diet/exercise/weight guidelines.  ?

## 2021-06-05 NOTE — Progress Notes (Signed)
? ?Subjective:  ? ? ? Patient ID: [name], female    DOB: [birth year], 46 y.o.   MRN: [id number] ? ?No chief complaint on file. ? ? ?HPI ?Patient is in today for cpx.  ? ?Immunizations:Tdap due. She had a reaction ?Diet: healthy ?Exercise: 6 times a week ?Wt Readings from Last 3 Encounters:  ?06/05/21 133 lb 6.4 oz (60.5 kg)  ?11/07/20 136 lb (61.7 kg)  ?04/19/19 142 lb (64.4 kg)  ?Colonoscopy: due ?Pap Smear: 05/09/19 ?Mammogram:  07/17/20 ?Vision: due ?Dental: up to date ? ? ?Health Maintenance Due  ?Topic Date Due  ? Hepatitis C Screening  Never done  ? TETANUS/TDAP  03/17/2017  ? COLONOSCOPY (Pts 45-6yr Insurance coverage will need to be confirmed)  Never done  ? COVID-19 Vaccine (3 - Pfizer risk series) 04/20/2020  ? ? ?Past Medical History:  ?Diagnosis Date  ? Acute medial meniscus tear of right knee   ? H/O pyelonephritis 2001 & 2009  ? ? ?Past Surgical History:  ?Procedure Laterality Date  ? APPENDECTOMY  age 46 ? KNEE ARTHROSCOPY Right 06/08/2015  ? Procedure: RIGHT KNEE ARTHROSCOPY WITH DEBRIDEMENT;  Surgeon: FFrederik Pear MD;  [place]  Service: Orthopedics;  Laterality: Right;  ? ? ?Family History  ?Problem Relation Age of Onset  ? Hyperlipidemia Mother   ? Heart attack Father   ?     cabg x 6  ? Hypertension Father   ? Thyroid disease Father   ?     hypethyroidism  ? Hyperlipidemia Maternal Grandmother   ? Cancer Maternal Grandmother   ?     breast  ? Hyperlipidemia Maternal Grandfather   ? Hypertension Maternal Grandfather   ? Heart attack Maternal Grandfather   ? Cancer Maternal Grandfather   ?     prostate  ? Heart disease Maternal Grandfather   ? Stroke Maternal Grandfather   ? Diabetes Paternal Grandmother   ? Arthritis Paternal Grandmother   ? Heart attack Paternal Grandfather   ? Asthma Neg Hx   ? Alcohol abuse Neg Hx   ? Drug abuse Neg Hx   ? ? ?Social History  ? ?Socioeconomic History  ? Marital status: Married  ?  Spouse name: Not on file  ? Number of children:  2  ? Years of education: Not on file  ? Highest education level: Not on file  ?Occupational History  ? Occupation: PSoftware engineer ?  Employer: [employer] ?  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 ?  Employer: [employer]  ?Tobacco Use  ? Smoking status: Never  ? Smokeless tobacco: Never  ?Substance and Sexual Activity  ? Alcohol use: Yes  ?  Alcohol/week: 2.0 standard drinks  ?  Types: 2 Glasses of wine per week  ?  Comment: social  ? Drug use: No  ? Sexual activity: Yes  ?  Partners: Male  ?  Birth control/protection: Surgical  ?Other Topics Concern  ? Not on file  ?Social History Narrative  ? Married  ? 2 children- 2009- daughter  ? 2012 son  ? Pharmacist- works for [employer]  ? Enjoys spending time with kids, shopping, exercise.   ? ?Social Determinants of Health  ? ?Financial Resource Strain: Not on file  ?Food Insecurity: Not on file  ?Transportation Needs: Not on file  ?Physical Activity: Not on file  ?Stress: Not on file  ?Social Connections: Not on file  ?  Intimate Partner Violence: Not on file  ? ? ?Outpatient Medications Prior to Visit  ?Medication Sig Dispense Refill  ? COVID-19 At Home Antigen Test [facility] COVID-19 HOME TEST) KIT Use as directed per package instructions 4 each 0  ? influenza vac split quadrivalent PF (FLUARIX) 0.5 ML injection Inject into the muscle. 0.5 mL 0  ? ?No facility-administered medications prior to visit.  ? ? ?Allergies  ?Allergen Reactions  ? Other Swelling  ?  "Lot of swelling and scar tissue at injection site" ?"Lot of swelling and scar tissue at injection site"            t-dap vaccine ?"Lot of swelling and scar tissue at injection site"  ? Tdap [Tetanus-Diphth-Acell Pertussis] Swelling  ?  "Lot of swelling and scar tissue at injection site"  ? ? ?Review of Systems  ?Constitutional:  Negative for malaise/fatigue.  ?HENT:  Negative for congestion and hearing loss.   ?Eyes:  Negative for blurred vision.   ?Respiratory:  Negative for cough.   ?Cardiovascular:  Negative for chest pain.  ?Gastrointestinal:  Negative for constipation and diarrhea.  ?Genitourinary:  Negative for dysuria and frequency.  ?Musculoskeletal:  Negative for joint pain and myalgias.  ?Neurological:  Negative for headaches.  ?Psychiatric/Behavioral:    ?     Denies depression/anxiety  ? ?BP Readings from Last 3 Encounters:  ?06/05/21 (!) 150/78  ?11/07/20 134/80  ?04/19/19 132/80  ? ? ?   ?Objective:  ?  ?Physical Exam ? ?BP (!) 150/78   Pulse 68   Temp 97.8 ?F (36.6 ?C) (Oral)   Resp 16   Wt 133 lb 6.4 oz (60.5 kg)   SpO2 100%   BMI 22.20 kg/m?  ?Wt Readings from Last 3 Encounters:  ?06/05/21 133 lb 6.4 oz (60.5 kg)  ?11/07/20 136 lb (61.7 kg)  ?04/19/19 142 lb (64.4 kg)  ? ?Physical Exam  ?Constitutional: She is oriented to person, place, and time. She appears well-developed and well-nourished. No distress.  ?HENT:  ?Head: Normocephalic and atraumatic.  ?Right Ear: Tympanic membrane and ear canal normal.  ?Left Ear: Tympanic membrane and ear canal normal.  ?Mouth/Throat:  not examined- pt wearing mask ?Eyes: Pupils are equal, round, and reactive to light. No scleral icterus.  ?Neck: Normal range of motion. No thyromegaly present.  ?Cardiovascular: Normal rate and regular rhythm.   ?No murmur heard. ?Pulmonary/Chest: Effort normal and breath sounds normal. No respiratory distress. He has no wheezes. She has no rales. She exhibits no tenderness.  ?Abdominal: Soft. Bowel sounds are normal. She exhibits no distension and no mass. There is no tenderness. There is no rebound and no guarding.  ?Musculoskeletal: She exhibits no edema.  ?Lymphadenopathy:  ?  She has no cervical adenopathy.  ?Neurological: She is alert and oriented to person, place, and time. She has normal patellar reflexes. She exhibits normal muscle tone. Coordination normal.  ?Skin: Skin is warm and dry.  ?Psychiatric: She has a normal mood and affect. Her behavior is normal.  Judgment and thought content normal.  ?Breasts: Examined lying ?Right: Without masses, retractions, discharge or axillary adenopathy.  ?Left: Without masses, retractions, discharge or axillary adenopathy. ?Pelvic: deferred ? ? ? ? ? ? ?    ?Assessment & Plan:  ? ? ?   ?Assessment & Plan:  ? ?Problem List Items Addressed This Visit   ? ?  ? Unprioritized  ? Preventative health care - Primary  ?  Declines tetanus due to prior reaction. Declines covid bivalent booster.  Flu shot up to date. Refer for mammo and colo. Pap up to date.  ?  ?  ? Relevant Orders  ? MM 3D SCREEN BREAST BILATERAL  ? Lipid panel  ? Elevated blood pressure reading  ?  Has family hx of HTN. Will plan to repeat in 2 weeks. If still elevated will plan to start antihypertensive. She is already following diet/exercise/weight guidelines.  ?  ?  ? ?Other Visit Diagnoses   ? ? Colon cancer screening      ? Relevant Orders  ? Ambulatory referral to Gastroenterology  ? ?  ? ? ?I have discontinued [name]. Pospisil "[name]"'s influenza vac split quadrivalent PF. I am also having her maintain her [facility] COVID-19 Home Test. ? ?No orders of the defined types were placed in this encounter. ? ?

## 2021-06-07 ENCOUNTER — Other Ambulatory Visit (HOSPITAL_BASED_OUTPATIENT_CLINIC_OR_DEPARTMENT_OTHER): Payer: Self-pay

## 2021-06-12 DIAGNOSIS — M9903 Segmental and somatic dysfunction of lumbar region: Secondary | ICD-10-CM | POA: Diagnosis not present

## 2021-06-12 DIAGNOSIS — M9904 Segmental and somatic dysfunction of sacral region: Secondary | ICD-10-CM | POA: Diagnosis not present

## 2021-06-12 DIAGNOSIS — M9902 Segmental and somatic dysfunction of thoracic region: Secondary | ICD-10-CM | POA: Diagnosis not present

## 2021-06-12 DIAGNOSIS — M5417 Radiculopathy, lumbosacral region: Secondary | ICD-10-CM | POA: Diagnosis not present

## 2021-06-19 DIAGNOSIS — M9903 Segmental and somatic dysfunction of lumbar region: Secondary | ICD-10-CM | POA: Diagnosis not present

## 2021-06-19 DIAGNOSIS — M9902 Segmental and somatic dysfunction of thoracic region: Secondary | ICD-10-CM | POA: Diagnosis not present

## 2021-06-19 DIAGNOSIS — M5417 Radiculopathy, lumbosacral region: Secondary | ICD-10-CM | POA: Diagnosis not present

## 2021-06-19 DIAGNOSIS — M9904 Segmental and somatic dysfunction of sacral region: Secondary | ICD-10-CM | POA: Diagnosis not present

## 2021-06-20 ENCOUNTER — Other Ambulatory Visit (HOSPITAL_BASED_OUTPATIENT_CLINIC_OR_DEPARTMENT_OTHER): Payer: Self-pay

## 2021-06-20 ENCOUNTER — Ambulatory Visit (AMBULATORY_SURGERY_CENTER): Payer: 59

## 2021-06-20 VITALS — Ht 65.0 in | Wt 130.0 lb

## 2021-06-20 DIAGNOSIS — Z1211 Encounter for screening for malignant neoplasm of colon: Secondary | ICD-10-CM

## 2021-06-20 MED ORDER — ONDANSETRON HCL 4 MG PO TABS
4.0000 mg | ORAL_TABLET | ORAL | 0 refills | Status: DC
Start: 2021-06-20 — End: 2022-05-12
  Filled 2021-06-20: qty 2, 1d supply, fill #0

## 2021-06-20 MED ORDER — NA SULFATE-K SULFATE-MG SULF 17.5-3.13-1.6 GM/177ML PO SOLN
1.0000 | Freq: Once | ORAL | 0 refills | Status: AC
Start: 2021-06-20 — End: 2021-06-21
  Filled 2021-06-20: qty 354, 1d supply, fill #0

## 2021-06-20 NOTE — Progress Notes (Signed)

## 2021-06-25 ENCOUNTER — Ambulatory Visit: Payer: 59 | Admitting: Family

## 2021-06-25 DIAGNOSIS — E785 Hyperlipidemia, unspecified: Secondary | ICD-10-CM

## 2021-06-25 DIAGNOSIS — R030 Elevated blood-pressure reading, without diagnosis of hypertension: Secondary | ICD-10-CM | POA: Diagnosis not present

## 2021-06-25 NOTE — Assessment & Plan Note (Signed)
Lab Results  ?Component Value Date  ? CHOL 213 (H) 06/05/2021  ? HDL 72.00 06/05/2021  ? LDLCALC 131 (H) 06/05/2021  ? TRIG 51.0 06/05/2021  ? CHOLHDL 3 06/05/2021  ? ?CV risk calculation for 10 yrs is 3%.  Does not meet criteria for statin. Continue diet and regular exercise efforts.  ?

## 2021-06-25 NOTE — Progress Notes (Signed)
? ?Subjective:  ? ?By signing my name below, I, [name], attest that this documentation has been prepared under the direction and in the presence of [name] NP, 06/25/2021  ? ? Patient ID: [name], female    DOB: [birth year], 46 y.o.   MRN: [id number] ? ?Chief Complaint  ?Patient presents with  ? Hypertension  ?  Here for follow up on elevated blood pressure reading  ? ? ?HPI ?Patient is in today for an office visit. ? ?Blood Pressure - As of today's visit, her blood pressure is good. She has recently purchased a blood pressure monitor.She is taking her medications as prescribed. She is also eating consciously.  ?BP Readings from Last 3 Encounters:  ?06/25/21 116/70  ?06/05/21 (!) 150/78  ?11/07/20 134/80  ? ? ?Health Maintenance Due  ?Topic Date Due  ? Hepatitis C Screening  Never done  ? TETANUS/TDAP  03/17/2017  ? COLONOSCOPY (Pts 45-7yrs Insurance coverage will need to be confirmed)  Never done  ? COVID-19 Vaccine (3 - Pfizer risk series) 04/20/2020  ? ? ?Past Medical History:  ?Diagnosis Date  ? Acute medial meniscus tear of right knee   ? H/O pyelonephritis 2001 & 2009  ? ? ?Past Surgical History:  ?Procedure Laterality Date  ? APPENDECTOMY  age 11  ? KNEE ARTHROSCOPY Right 06/08/2015  ? Procedure: RIGHT KNEE ARTHROSCOPY WITH DEBRIDEMENT;  Surgeon: Frederik Pear, MD;  Location: [facility];  Service: Orthopedics;  Laterality: Right;  ? ? ?Family History  ?Problem Relation Age of Onset  ? Hyperlipidemia Mother   ? Heart attack Father   ?     cabg x 6  ? Hypertension Father   ? Thyroid disease Father   ?     hypethyroidism  ? Hyperlipidemia Maternal Grandmother   ? Cancer Maternal Grandmother   ?     breast  ? Hyperlipidemia Maternal Grandfather   ? Hypertension Maternal Grandfather   ? Heart attack Maternal Grandfather   ? Cancer Maternal Grandfather   ?     prostate  ? Heart disease Maternal Grandfather   ? Stroke Maternal Grandfather   ? Diabetes Paternal Grandmother   ?  Arthritis Paternal Grandmother   ? Heart attack Paternal Grandfather   ? Asthma Neg Hx   ? Alcohol abuse Neg Hx   ? Drug abuse Neg Hx   ? Colon cancer Neg Hx   ? Colon polyps Neg Hx   ? Esophageal cancer Neg Hx   ? Stomach cancer Neg Hx   ? Rectal cancer Neg Hx   ? ? ?Social History  ? ?Socioeconomic History  ? Marital status: Married  ?  Spouse name: Not on file  ? Number of children: 2  ? Years of education: Not on file  ? Highest education level: Not on file  ?Occupational History  ? Occupation: Software engineer  ?  Employer: [employer]  ?  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  Employer: [employer]  ?Tobacco Use  ? Smoking status: Never  ? Smokeless tobacco: Never  ?Vaping Use  ? Vaping Use: Never used  ?Substance and Sexual Activity  ? Alcohol use: Yes  ?  Alcohol/week: 2.0 standard drinks  ?  Types: 2 Glasses of wine per week  ?  Comment: social  ? Drug use: Never  ? Sexual activity: Yes  ?  Partners: Male  ?  Birth control/protection: Surgical  ?  Other Topics Concern  ? Not on file  ?Social History Narrative  ? Married  ? 2 children- 2009- daughter  ? 2012 son  ? Pharmacist- works for [employer]  ? Enjoys spending time with kids, shopping, exercise.   ? ?Social Determinants of Health  ? ?Financial Resource Strain: Not on file  ?Food Insecurity: Not on file  ?Transportation Needs: Not on file  ?Physical Activity: Not on file  ?Stress: Not on file  ?Social Connections: Not on file  ?Intimate Partner Violence: Not on file  ? ? ?Outpatient Medications Prior to Visit  ?Medication Sig Dispense Refill  ? Blood Pressure Monitoring (OMRON 3 SERIES BP MONITOR) DEVI Use as directed 1 each 0  ? ibuprofen (ADVIL) 400 MG tablet Take 400 mg by mouth every 6 (six) hours as needed.    ? ondansetron (ZOFRAN) 4 MG tablet Take 1 tablet (4 mg total) by mouth as directed for 2 doses. Take 1 tablet ($RemoveB'4mg'BtMtIRYf$  total) 30-60 minutes before each prep dose. 2 tablet 0  ? COVID-19 At  Home Antigen Test [facility] COVID-19 HOME TEST) KIT Use as directed per package instructions 4 each 0  ? ?No facility-administered medications prior to visit.  ? ? ?Allergies  ?Allergen Reactions  ? Other Swelling  ?  "Lot of swelling and scar tissue at injection site" ?"Lot of swelling and scar tissue at injection site"            t-dap vaccine ?"Lot of swelling and scar tissue at injection site"  ? Tdap [Tetanus-Diphth-Acell Pertussis] Swelling  ?  "Lot of swelling and scar tissue at injection site"  ? ? ?ROS ?See HPI ?   ?Objective:  ?  ?Physical Exam ?Constitutional:   ?   General: She is not in acute distress. ?   Appearance: Normal appearance. She is not ill-appearing.  ?HENT:  ?   Head: Normocephalic and atraumatic.  ?   Right Ear: External ear normal.  ?   Left Ear: External ear normal.  ?Eyes:  ?   Extraocular Movements: Extraocular movements intact.  ?   Pupils: Pupils are equal, round, and reactive to light.  ?Cardiovascular:  ?   Rate and Rhythm: Normal rate.  ?Pulmonary:  ?   Effort: Pulmonary effort is normal.  ?Skin: ?   General: Skin is warm and dry.  ?Neurological:  ?   Mental Status: She is alert and oriented to person, place, and time.  ?Psychiatric:     ?   Mood and Affect: Mood normal.     ?   Behavior: Behavior normal.     ?   Judgment: Judgment normal.  ? ? ?BP 116/70 (BP Location: Right Arm, Patient Position: Sitting, Cuff Size: Small)   Pulse 67   Temp 97.8 ?F (36.6 ?C) (Oral)   Resp 16   Wt 131 lb (59.4 kg)   SpO2 100%   BMI 21.80 kg/m?  ?Wt Readings from Last 3 Encounters:  ?06/25/21 131 lb (59.4 kg)  ?06/20/21 130 lb (59 kg)  ?06/05/21 133 lb 6.4 oz (60.5 kg)  ? ? ?   ?Assessment & Plan:  ? ?Problem List Items Addressed This Visit   ? ?  ? Unprioritized  ? Hyperlipidemia  ?  Lab Results  ?Component Value Date  ? CHOL 213 (H) 06/05/2021  ? HDL 72.00 06/05/2021  ? LDLCALC 131 (H) 06/05/2021  ? TRIG 51.0 06/05/2021  ? CHOLHDL 3 06/05/2021  ?CV risk calculation for 10 yrs is 3%.  Does  not meet criteria for statin. Continue diet and regular exercise efforts.  ?  ?  ? Elevated blood pressure reading  ?  BP Readings from Last 3 Encounters:  ?06/25/21 116/70  ?06/05/21 (!) 150/78  ?11/07/20 134/80  ?Follow up blood pressure is WNL.  Pt purchased a blood pressure cuff and will continue to monitor BP weekly. She will let me know if she sees readings >140/90. ?  ?  ? ? ? ?No orders of the defined types were placed in this encounter. ? ? ?I, Nance Pear, NP, personally preformed the services described in this documentation.  All medical record entries made by the scribe were at my direction and in my presence.  I have reviewed the chart and discharge instructions (if applicable) and agree that the record reflects my personal performance and is accurate and complete. 06/25/2021 ? ? ?I,Amber Collins,acting as a Education administrator for [employer], NP.,have documented all relevant documentation on the behalf of Nance Pear, NP,as directed by  Nance Pear, NP while in the presence of Nance Pear, NP. ? ? ? ?Nance Pear, NP ? ?

## 2021-06-25 NOTE — Assessment & Plan Note (Signed)
BP Readings from Last 3 Encounters:  ?06/25/21 116/70  ?06/05/21 (!) 150/78  ?11/07/20 134/80  ? ?Follow up blood pressure is WNL.  Pt purchased a blood pressure cuff and will continue to monitor BP weekly. She will let me know if she sees readings >140/90. ?

## 2021-06-25 NOTE — Patient Instructions (Signed)
Please check your blood pressure once weekly. ?Call if your blood pressure is running >140/90.  ?

## 2021-06-26 DIAGNOSIS — M5417 Radiculopathy, lumbosacral region: Secondary | ICD-10-CM | POA: Diagnosis not present

## 2021-06-26 DIAGNOSIS — M9903 Segmental and somatic dysfunction of lumbar region: Secondary | ICD-10-CM | POA: Diagnosis not present

## 2021-06-26 DIAGNOSIS — M9902 Segmental and somatic dysfunction of thoracic region: Secondary | ICD-10-CM | POA: Diagnosis not present

## 2021-06-26 DIAGNOSIS — M9904 Segmental and somatic dysfunction of sacral region: Secondary | ICD-10-CM | POA: Diagnosis not present

## 2021-07-03 ENCOUNTER — Encounter: Payer: Self-pay | Admitting: Gastroenterology

## 2021-07-03 DIAGNOSIS — M9903 Segmental and somatic dysfunction of lumbar region: Secondary | ICD-10-CM | POA: Diagnosis not present

## 2021-07-03 DIAGNOSIS — M5417 Radiculopathy, lumbosacral region: Secondary | ICD-10-CM | POA: Diagnosis not present

## 2021-07-03 DIAGNOSIS — M9902 Segmental and somatic dysfunction of thoracic region: Secondary | ICD-10-CM | POA: Diagnosis not present

## 2021-07-03 DIAGNOSIS — M9904 Segmental and somatic dysfunction of sacral region: Secondary | ICD-10-CM | POA: Diagnosis not present

## 2021-07-10 DIAGNOSIS — M9904 Segmental and somatic dysfunction of sacral region: Secondary | ICD-10-CM | POA: Diagnosis not present

## 2021-07-10 DIAGNOSIS — M9902 Segmental and somatic dysfunction of thoracic region: Secondary | ICD-10-CM | POA: Diagnosis not present

## 2021-07-10 DIAGNOSIS — M5417 Radiculopathy, lumbosacral region: Secondary | ICD-10-CM | POA: Diagnosis not present

## 2021-07-10 DIAGNOSIS — M9903 Segmental and somatic dysfunction of lumbar region: Secondary | ICD-10-CM | POA: Diagnosis not present

## 2021-07-12 ENCOUNTER — Encounter: Payer: Self-pay | Admitting: Gastroenterology

## 2021-07-12 ENCOUNTER — Ambulatory Visit (AMBULATORY_SURGERY_CENTER): Payer: 59 | Admitting: Gastroenterology

## 2021-07-12 VITALS — BP 95/74 | HR 48 | Temp 98.6°F | Resp 11 | Ht 65.0 in | Wt 130.0 lb

## 2021-07-12 DIAGNOSIS — K5730 Diverticulosis of large intestine without perforation or abscess without bleeding: Secondary | ICD-10-CM | POA: Diagnosis not present

## 2021-07-12 DIAGNOSIS — Z1211 Encounter for screening for malignant neoplasm of colon: Secondary | ICD-10-CM | POA: Diagnosis not present

## 2021-07-12 MED ORDER — SODIUM CHLORIDE 0.9 % IV SOLN: 500.0000 mL | INTRAVENOUS | Status: DC

## 2021-07-12 NOTE — Progress Notes (Signed)
Pt's states no medical or surgical changes since previsit or office visit. 

## 2021-07-12 NOTE — Patient Instructions (Signed)

## 2021-07-12 NOTE — Progress Notes (Signed)
Report to PACU, RN, vss, BBS= Clear.  

## 2021-07-12 NOTE — Progress Notes (Signed)
? ?GASTROENTEROLOGY PROCEDURE H&P NOTE  ? ?Primary Care Physician: ?Silverio Decamp, MD ? ? ? ?Reason for Procedure:  Colon Cancer screening ? ?Plan:    Colonoscopy ? ?Patient is appropriate for endoscopic procedure(s) in the ambulatory ([facility]) setting. ? ?The nature of the procedure, as well as the risks, benefits, and alternatives were carefully and thoroughly reviewed with the patient. Ample time for discussion and questions allowed. The patient understood, was satisfied, and agreed to proceed.  ? ? ? ?HPI: ?[name] is a 46 y.o. female who presents for colonoscopy for routine Colon Cancer screening.  No active GI symptoms.  No known family history of colon cancer or related malignancy.  Patient is otherwise without complaints or active issues today. ? ?Past Medical History:  ?Diagnosis Date  ? Acute medial meniscus tear of right knee   ? H/O pyelonephritis 2001 & 2009  ? ? ?Past Surgical History:  ?Procedure Laterality Date  ? APPENDECTOMY  age 67  ? KNEE ARTHROSCOPY Right 06/08/2015  ? Procedure: RIGHT KNEE ARTHROSCOPY WITH DEBRIDEMENT;  Surgeon: Frederik Pear, MD;  Location: [facility];  Service: Orthopedics;  Laterality: Right;  ? ? ?Prior to Admission medications   ?Medication Sig Start Date End Date Taking? Authorizing Provider  ?ondansetron (ZOFRAN) 4 MG tablet Take 1 tablet (4 mg total) by mouth as directed for 2 doses. Take 1 tablet ('4mg'$  total) 30-60 minutes before each prep dose. 06/20/21  Yes Brette Cast V, DO  ?Blood Pressure Monitoring (OMRON 3 SERIES BP MONITOR) DEVI Use as directed 06/05/21   Clementeen Graham, RPH  ?ibuprofen (ADVIL) 400 MG tablet Take 400 mg by mouth every 6 (six) hours as needed.    [provider]  ? ? ?Current Outpatient Medications  ?Medication Sig Dispense Refill  ? ondansetron (ZOFRAN) 4 MG tablet Take 1 tablet (4 mg total) by mouth as directed for 2 doses. Take 1 tablet ('4mg'$  total) 30-60 minutes before each prep dose. 2 tablet 0  ?  Blood Pressure Monitoring (OMRON 3 SERIES BP MONITOR) DEVI Use as directed 1 each 0  ? ibuprofen (ADVIL) 400 MG tablet Take 400 mg by mouth every 6 (six) hours as needed.    ? ?Current Facility-Administered Medications  ?Medication Dose Route Frequency Provider Last Rate Last Admin  ? 0.9 %  sodium chloride infusion  500 mL Intravenous Continuous Armon Orvis V, DO      ? ? ?Allergies as of 07/12/2021 - Review Complete 07/12/2021  ?Allergen Reaction Noted  ? Other Swelling 11/13/2014  ? Tdap [tetanus-diphth-acell pertussis] Swelling 11/13/2014  ? ? ?Family History  ?Problem Relation Age of Onset  ? Hyperlipidemia Mother   ? Heart attack Father   ?     cabg x 6  ? Hypertension Father   ? Thyroid disease Father   ?     hypethyroidism  ? Hyperlipidemia Maternal Grandmother   ? Cancer Maternal Grandmother   ?     breast  ? Hyperlipidemia Maternal Grandfather   ? Hypertension Maternal Grandfather   ? Heart attack Maternal Grandfather   ? Cancer Maternal Grandfather   ?     prostate  ? Heart disease Maternal Grandfather   ? Stroke Maternal Grandfather   ? Diabetes Paternal Grandmother   ? Arthritis Paternal Grandmother   ? Heart attack Paternal Grandfather   ? Asthma Neg Hx   ? Alcohol abuse Neg Hx   ? Drug abuse Neg Hx   ? Colon cancer Neg  Hx   ? Colon polyps Neg Hx   ? Esophageal cancer Neg Hx   ? Stomach cancer Neg Hx   ? Rectal cancer Neg Hx   ? ? ?Social History  ? ?Socioeconomic History  ? Marital status: Married  ?  Spouse name: Not on file  ? Number of children: 2  ? Years of education: Not on file  ? Highest education level: Not on file  ?Occupational History  ? Occupation: Software engineer  ?  Employer: [employer]  ?  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  ?  Employer: [employer]  ?Tobacco Use  ? Smoking status: Never  ? Smokeless tobacco: Never  ?Vaping Use  ? Vaping Use: Never used  ?Substance and Sexual Activity  ? Alcohol use: Yes  ?   Alcohol/week: 2.0 standard drinks  ?  Types: 2 Glasses of wine per week  ?  Comment: social  ? Drug use: Never  ? Sexual activity: Yes  ?  Partners: Male  ?  Birth control/protection: Surgical  ?Other Topics Concern  ? Not on file  ?Social History Narrative  ? Married  ? 2 children- 2009- daughter  ? 2012 son  ? Pharmacist- works for [employer]  ? Enjoys spending time with kids, shopping, exercise.   ? ?Social Determinants of Health  ? ?Financial Resource Strain: Not on file  ?Food Insecurity: Not on file  ?Transportation Needs: Not on file  ?Physical Activity: Not on file  ?Stress: Not on file  ?Social Connections: Not on file  ?Intimate Partner Violence: Not on file  ? ? ?Physical Exam: ?Vital signs in last 24 hours: ?'@BP'$  122/75   Pulse (!) 57   Temp 98.6 ?F (37 ?C) (Temporal)   Ht '5\' 5"'$  (1.651 m)   Wt 130 lb (59 kg)   SpO2 100%   BMI 21.63 kg/m?  ?GEN: NAD ?EYE: Sclerae anicteric ?ENT: MMM ?CV: Non-tachycardic ?Pulm: CTA b/l ?GI: Soft, NT/ND ?NEURO:  Alert & Oriented x 3 ? ? ?Gerrit Heck, DO ?[facility] ? ? ?07/12/2021 8:54 AM ? ?

## 2021-07-12 NOTE — Op Note (Signed)
[facility] ?Patient Name: [name] ?Procedure Date: 07/12/2021 8:50 AM ?MRN: [id number] ?Endoscopist: Gerrit Heck , MD ?Age: 47 ?Referring MD:  ?Date of Birth: 03/16/1976 ?Gender: Female ?Account #: [id number] ?Procedure:                Colonoscopy ?Indications:              Screening for colorectal malignant neoplasm, This  ?                          is the patient's first colonoscopy ?Medicines:                Monitored Anesthesia Care ?Procedure:                Pre-Anesthesia Assessment: ?                          - Prior to the procedure, a History and Physical  ?                          was performed, and patient medications and  ?                          allergies were reviewed. The patient's tolerance of  ?                          previous anesthesia was also reviewed. The risks  ?                          and benefits of the procedure and the sedation  ?                          options and risks were discussed with the patient.  ?                          All questions were answered, and informed consent  ?                          was obtained. Prior Anticoagulants: The patient has  ?                          taken no previous anticoagulant or antiplatelet  ?                          agents. ASA Grade Assessment: I - A normal, healthy  ?                          patient. After reviewing the risks and benefits,  ?                          the patient was deemed in satisfactory condition to  ?                          undergo the procedure. ?  After obtaining informed consent, the colonoscope  ?                          was passed under direct vision. Throughout the  ?                          procedure, the patient's blood pressure, pulse, and  ?                          oxygen saturations were monitored continuously. The  ?                          Olympus PCF-H190DL ([id number]) Colonoscope was  ?                          introduced through the anus and  advanced to the the  ?                          terminal ileum. The colonoscopy was technically  ?                          difficult and complex due to significant looping  ?                          and a tortuous colon. Successful completion of the  ?                          procedure was aided by applying abdominal pressure.  ?                          The patient tolerated the procedure well. The  ?                          quality of the bowel preparation was excellent. The  ?                          terminal ileum, ileocecal valve, appendiceal  ?                          orifice, and rectum were photographed. ?Scope In: 9:01:16 AM ?Scope Out: 9:20:11 AM ?Scope Withdrawal Time: 0 hours 11 minutes 36 seconds  ?Total Procedure Duration: 0 hours 18 minutes 55 seconds  ?Findings:                 The perianal and digital rectal examinations were  ?                          normal. ?                          A few small-mouthed diverticula were found in the  ?                          sigmoid colon. ?  The entire colon otherwise appeared normal. ?                          The sigmoid colon was moderately tortuous and there  ?                          was looping in the ascending colon. Advancing the  ?                          scope required a series of advancments and  ?                          withdrawals of the colonoscope to reduce loop  ?                          formation along with using manual abdominal  ?                          pressure. ?                          The retroflexed view of the distal rectum and anal  ?                          verge was normal and showed no anal or rectal  ?                          abnormalities. ?                          The terminal ileum appeared normal. ?Complications:            No immediate complications. ?Estimated Blood Loss:     Estimated blood loss: none. ?Impression:               - Mild diverticulosis in the sigmoid colon. ?                           - The entire examined colon is otherwise normal. ?                          - Tortuous colon. ?                          - The distal rectum and anal verge are normal on  ?                          retroflexion view. ?                          - The examined portion of the ileum was normal. ?                          - No specimens collected. ?Recommendation:           - Patient has a contact number available for  ?  emergencies. The signs and symptoms of potential  ?                          delayed complications were discussed with the  ?                          patient. Return to normal activities tomorrow.  ?                          Written discharge instructions were provided to the  ?                          patient. ?                          - Resume previous diet. ?                          - Continue present medications. ?                          - Repeat colonoscopy in 10 years for screening  ?                          purposes. ?                          - Return to GI office PRN. ?Gerrit Heck, MD ?07/12/2021 9:27:39 AM ?

## 2021-07-16 ENCOUNTER — Telehealth: Payer: Self-pay

## 2021-07-16 NOTE — Telephone Encounter (Signed)
?  Follow up Call- ? ? ?  07/12/2021  ?  8:29 AM  ?Call back number  ?Post procedure Call Back phone  # [phone]  ?Permission to leave phone message Yes  ?  ? ?Patient questions: ? ?Do you have a fever, pain , or abdominal swelling? No. ?Pain Score  0 * ? ?Have you tolerated food without any problems? Yes.   ? ?Have you been able to return to your normal activities? Yes.   ? ?Do you have any questions about your discharge instructions: ?Diet   No. ?Medications  No. ?Follow up visit  No. ? ?Do you have questions or concerns about your Care? No. ? ?Actions: ?* If pain score is 4 or above: ?No action needed, pain <4. ? ? ?

## 2021-07-22 ENCOUNTER — Ambulatory Visit (HOSPITAL_BASED_OUTPATIENT_CLINIC_OR_DEPARTMENT_OTHER)
Admission: RE | Admit: 2021-07-22 | Discharge: 2021-07-22 | Disposition: A | Discharge status: Home / Self Care | Payer: 59 | Source: Ambulatory Visit | Attending: Family | Admitting: Family

## 2021-07-22 ENCOUNTER — Encounter (HOSPITAL_BASED_OUTPATIENT_CLINIC_OR_DEPARTMENT_OTHER): Payer: Self-pay

## 2021-07-22 DIAGNOSIS — Z1231 Encounter for screening mammogram for malignant neoplasm of breast: Secondary | ICD-10-CM | POA: Diagnosis not present

## 2021-07-22 DIAGNOSIS — Z0000 Encounter for general adult medical examination without abnormal findings: Secondary | ICD-10-CM | POA: Diagnosis present

## 2021-07-23 DIAGNOSIS — M9904 Segmental and somatic dysfunction of sacral region: Secondary | ICD-10-CM | POA: Diagnosis not present

## 2021-07-23 DIAGNOSIS — M9902 Segmental and somatic dysfunction of thoracic region: Secondary | ICD-10-CM | POA: Diagnosis not present

## 2021-07-23 DIAGNOSIS — M5417 Radiculopathy, lumbosacral region: Secondary | ICD-10-CM | POA: Diagnosis not present

## 2021-07-23 DIAGNOSIS — M9903 Segmental and somatic dysfunction of lumbar region: Secondary | ICD-10-CM | POA: Diagnosis not present

## 2021-08-06 DIAGNOSIS — M9902 Segmental and somatic dysfunction of thoracic region: Secondary | ICD-10-CM | POA: Diagnosis not present

## 2021-08-06 DIAGNOSIS — M9904 Segmental and somatic dysfunction of sacral region: Secondary | ICD-10-CM | POA: Diagnosis not present

## 2021-08-06 DIAGNOSIS — M5417 Radiculopathy, lumbosacral region: Secondary | ICD-10-CM | POA: Diagnosis not present

## 2021-08-06 DIAGNOSIS — M9903 Segmental and somatic dysfunction of lumbar region: Secondary | ICD-10-CM | POA: Diagnosis not present

## 2021-08-20 DIAGNOSIS — M9902 Segmental and somatic dysfunction of thoracic region: Secondary | ICD-10-CM | POA: Diagnosis not present

## 2021-08-20 DIAGNOSIS — M9904 Segmental and somatic dysfunction of sacral region: Secondary | ICD-10-CM | POA: Diagnosis not present

## 2021-08-20 DIAGNOSIS — M9903 Segmental and somatic dysfunction of lumbar region: Secondary | ICD-10-CM | POA: Diagnosis not present

## 2021-08-20 DIAGNOSIS — M5417 Radiculopathy, lumbosacral region: Secondary | ICD-10-CM | POA: Diagnosis not present

## 2021-09-03 DIAGNOSIS — M5417 Radiculopathy, lumbosacral region: Secondary | ICD-10-CM | POA: Diagnosis not present

## 2021-09-03 DIAGNOSIS — M9904 Segmental and somatic dysfunction of sacral region: Secondary | ICD-10-CM | POA: Diagnosis not present

## 2021-09-03 DIAGNOSIS — M9903 Segmental and somatic dysfunction of lumbar region: Secondary | ICD-10-CM | POA: Diagnosis not present

## 2021-09-03 DIAGNOSIS — M9902 Segmental and somatic dysfunction of thoracic region: Secondary | ICD-10-CM | POA: Diagnosis not present

## 2021-09-24 DIAGNOSIS — M9902 Segmental and somatic dysfunction of thoracic region: Secondary | ICD-10-CM | POA: Diagnosis not present

## 2021-09-24 DIAGNOSIS — M9904 Segmental and somatic dysfunction of sacral region: Secondary | ICD-10-CM | POA: Diagnosis not present

## 2021-09-24 DIAGNOSIS — M5417 Radiculopathy, lumbosacral region: Secondary | ICD-10-CM | POA: Diagnosis not present

## 2021-09-24 DIAGNOSIS — M9903 Segmental and somatic dysfunction of lumbar region: Secondary | ICD-10-CM | POA: Diagnosis not present

## 2021-10-01 DIAGNOSIS — M5417 Radiculopathy, lumbosacral region: Secondary | ICD-10-CM | POA: Diagnosis not present

## 2021-10-01 DIAGNOSIS — M9904 Segmental and somatic dysfunction of sacral region: Secondary | ICD-10-CM | POA: Diagnosis not present

## 2021-10-01 DIAGNOSIS — M9902 Segmental and somatic dysfunction of thoracic region: Secondary | ICD-10-CM | POA: Diagnosis not present

## 2021-10-01 DIAGNOSIS — M9903 Segmental and somatic dysfunction of lumbar region: Secondary | ICD-10-CM | POA: Diagnosis not present

## 2021-10-08 DIAGNOSIS — M9902 Segmental and somatic dysfunction of thoracic region: Secondary | ICD-10-CM | POA: Diagnosis not present

## 2021-10-08 DIAGNOSIS — M9903 Segmental and somatic dysfunction of lumbar region: Secondary | ICD-10-CM | POA: Diagnosis not present

## 2021-10-08 DIAGNOSIS — M9904 Segmental and somatic dysfunction of sacral region: Secondary | ICD-10-CM | POA: Diagnosis not present

## 2021-10-08 DIAGNOSIS — M5417 Radiculopathy, lumbosacral region: Secondary | ICD-10-CM | POA: Diagnosis not present

## 2021-10-14 DIAGNOSIS — R221 Localized swelling, mass and lump, neck: Secondary | ICD-10-CM | POA: Diagnosis not present

## 2021-10-17 DIAGNOSIS — M5417 Radiculopathy, lumbosacral region: Secondary | ICD-10-CM | POA: Diagnosis not present

## 2021-10-17 DIAGNOSIS — M9903 Segmental and somatic dysfunction of lumbar region: Secondary | ICD-10-CM | POA: Diagnosis not present

## 2021-10-17 DIAGNOSIS — M9904 Segmental and somatic dysfunction of sacral region: Secondary | ICD-10-CM | POA: Diagnosis not present

## 2021-10-17 DIAGNOSIS — M9902 Segmental and somatic dysfunction of thoracic region: Secondary | ICD-10-CM | POA: Diagnosis not present

## 2021-10-24 DIAGNOSIS — M5417 Radiculopathy, lumbosacral region: Secondary | ICD-10-CM | POA: Diagnosis not present

## 2021-10-24 DIAGNOSIS — M9904 Segmental and somatic dysfunction of sacral region: Secondary | ICD-10-CM | POA: Diagnosis not present

## 2021-10-24 DIAGNOSIS — M9902 Segmental and somatic dysfunction of thoracic region: Secondary | ICD-10-CM | POA: Diagnosis not present

## 2021-10-24 DIAGNOSIS — M9903 Segmental and somatic dysfunction of lumbar region: Secondary | ICD-10-CM | POA: Diagnosis not present

## 2021-11-05 DIAGNOSIS — M9904 Segmental and somatic dysfunction of sacral region: Secondary | ICD-10-CM | POA: Diagnosis not present

## 2021-11-05 DIAGNOSIS — M9902 Segmental and somatic dysfunction of thoracic region: Secondary | ICD-10-CM | POA: Diagnosis not present

## 2021-11-05 DIAGNOSIS — M9903 Segmental and somatic dysfunction of lumbar region: Secondary | ICD-10-CM | POA: Diagnosis not present

## 2021-11-05 DIAGNOSIS — M5417 Radiculopathy, lumbosacral region: Secondary | ICD-10-CM | POA: Diagnosis not present

## 2021-11-08 DIAGNOSIS — M9903 Segmental and somatic dysfunction of lumbar region: Secondary | ICD-10-CM | POA: Diagnosis not present

## 2021-11-08 DIAGNOSIS — M5417 Radiculopathy, lumbosacral region: Secondary | ICD-10-CM | POA: Diagnosis not present

## 2021-11-08 DIAGNOSIS — M9904 Segmental and somatic dysfunction of sacral region: Secondary | ICD-10-CM | POA: Diagnosis not present

## 2021-11-08 DIAGNOSIS — M9902 Segmental and somatic dysfunction of thoracic region: Secondary | ICD-10-CM | POA: Diagnosis not present

## 2021-11-12 DIAGNOSIS — M5417 Radiculopathy, lumbosacral region: Secondary | ICD-10-CM | POA: Diagnosis not present

## 2021-11-12 DIAGNOSIS — M9904 Segmental and somatic dysfunction of sacral region: Secondary | ICD-10-CM | POA: Diagnosis not present

## 2021-11-12 DIAGNOSIS — M9902 Segmental and somatic dysfunction of thoracic region: Secondary | ICD-10-CM | POA: Diagnosis not present

## 2021-11-12 DIAGNOSIS — M9903 Segmental and somatic dysfunction of lumbar region: Secondary | ICD-10-CM | POA: Diagnosis not present

## 2021-11-26 DIAGNOSIS — M9903 Segmental and somatic dysfunction of lumbar region: Secondary | ICD-10-CM | POA: Diagnosis not present

## 2021-11-26 DIAGNOSIS — M5417 Radiculopathy, lumbosacral region: Secondary | ICD-10-CM | POA: Diagnosis not present

## 2021-11-26 DIAGNOSIS — M9904 Segmental and somatic dysfunction of sacral region: Secondary | ICD-10-CM | POA: Diagnosis not present

## 2021-11-26 DIAGNOSIS — M9902 Segmental and somatic dysfunction of thoracic region: Secondary | ICD-10-CM | POA: Diagnosis not present

## 2021-12-12 DIAGNOSIS — M5417 Radiculopathy, lumbosacral region: Secondary | ICD-10-CM | POA: Diagnosis not present

## 2021-12-12 DIAGNOSIS — M9903 Segmental and somatic dysfunction of lumbar region: Secondary | ICD-10-CM | POA: Diagnosis not present

## 2021-12-12 DIAGNOSIS — M9904 Segmental and somatic dysfunction of sacral region: Secondary | ICD-10-CM | POA: Diagnosis not present

## 2021-12-12 DIAGNOSIS — M9902 Segmental and somatic dysfunction of thoracic region: Secondary | ICD-10-CM | POA: Diagnosis not present

## 2021-12-26 DIAGNOSIS — M9903 Segmental and somatic dysfunction of lumbar region: Secondary | ICD-10-CM | POA: Diagnosis not present

## 2021-12-26 DIAGNOSIS — M9902 Segmental and somatic dysfunction of thoracic region: Secondary | ICD-10-CM | POA: Diagnosis not present

## 2021-12-26 DIAGNOSIS — M5417 Radiculopathy, lumbosacral region: Secondary | ICD-10-CM | POA: Diagnosis not present

## 2021-12-26 DIAGNOSIS — M9904 Segmental and somatic dysfunction of sacral region: Secondary | ICD-10-CM | POA: Diagnosis not present

## 2021-12-31 DIAGNOSIS — M5417 Radiculopathy, lumbosacral region: Secondary | ICD-10-CM | POA: Diagnosis not present

## 2021-12-31 DIAGNOSIS — M9902 Segmental and somatic dysfunction of thoracic region: Secondary | ICD-10-CM | POA: Diagnosis not present

## 2021-12-31 DIAGNOSIS — M9903 Segmental and somatic dysfunction of lumbar region: Secondary | ICD-10-CM | POA: Diagnosis not present

## 2021-12-31 DIAGNOSIS — M9904 Segmental and somatic dysfunction of sacral region: Secondary | ICD-10-CM | POA: Diagnosis not present

## 2022-01-07 DIAGNOSIS — M9902 Segmental and somatic dysfunction of thoracic region: Secondary | ICD-10-CM | POA: Diagnosis not present

## 2022-01-07 DIAGNOSIS — M5417 Radiculopathy, lumbosacral region: Secondary | ICD-10-CM | POA: Diagnosis not present

## 2022-01-07 DIAGNOSIS — M9904 Segmental and somatic dysfunction of sacral region: Secondary | ICD-10-CM | POA: Diagnosis not present

## 2022-01-07 DIAGNOSIS — M9903 Segmental and somatic dysfunction of lumbar region: Secondary | ICD-10-CM | POA: Diagnosis not present

## 2022-01-10 ENCOUNTER — Other Ambulatory Visit (HOSPITAL_BASED_OUTPATIENT_CLINIC_OR_DEPARTMENT_OTHER): Payer: Self-pay

## 2022-01-10 MED ORDER — FLUARIX QUADRIVALENT 0.5 ML IM SUSY
PREFILLED_SYRINGE | INTRAMUSCULAR | 0 refills | Status: AC
  Filled 2022-01-10: qty 0.5, 1d supply, fill #0

## 2022-01-16 ENCOUNTER — Other Ambulatory Visit: Payer: Self-pay

## 2022-01-21 DIAGNOSIS — M9902 Segmental and somatic dysfunction of thoracic region: Secondary | ICD-10-CM | POA: Diagnosis not present

## 2022-01-21 DIAGNOSIS — M5417 Radiculopathy, lumbosacral region: Secondary | ICD-10-CM | POA: Diagnosis not present

## 2022-01-21 DIAGNOSIS — M9904 Segmental and somatic dysfunction of sacral region: Secondary | ICD-10-CM | POA: Diagnosis not present

## 2022-01-21 DIAGNOSIS — M9903 Segmental and somatic dysfunction of lumbar region: Secondary | ICD-10-CM | POA: Diagnosis not present

## 2022-01-27 DIAGNOSIS — M9904 Segmental and somatic dysfunction of sacral region: Secondary | ICD-10-CM | POA: Diagnosis not present

## 2022-01-27 DIAGNOSIS — M5417 Radiculopathy, lumbosacral region: Secondary | ICD-10-CM | POA: Diagnosis not present

## 2022-01-27 DIAGNOSIS — M9902 Segmental and somatic dysfunction of thoracic region: Secondary | ICD-10-CM | POA: Diagnosis not present

## 2022-01-27 DIAGNOSIS — M9903 Segmental and somatic dysfunction of lumbar region: Secondary | ICD-10-CM | POA: Diagnosis not present

## 2022-02-04 DIAGNOSIS — M9903 Segmental and somatic dysfunction of lumbar region: Secondary | ICD-10-CM | POA: Diagnosis not present

## 2022-02-04 DIAGNOSIS — M5417 Radiculopathy, lumbosacral region: Secondary | ICD-10-CM | POA: Diagnosis not present

## 2022-02-04 DIAGNOSIS — M9904 Segmental and somatic dysfunction of sacral region: Secondary | ICD-10-CM | POA: Diagnosis not present

## 2022-02-04 DIAGNOSIS — M9902 Segmental and somatic dysfunction of thoracic region: Secondary | ICD-10-CM | POA: Diagnosis not present

## 2022-02-10 DIAGNOSIS — M9904 Segmental and somatic dysfunction of sacral region: Secondary | ICD-10-CM | POA: Diagnosis not present

## 2022-02-10 DIAGNOSIS — M9902 Segmental and somatic dysfunction of thoracic region: Secondary | ICD-10-CM | POA: Diagnosis not present

## 2022-02-10 DIAGNOSIS — M5417 Radiculopathy, lumbosacral region: Secondary | ICD-10-CM | POA: Diagnosis not present

## 2022-02-10 DIAGNOSIS — M9903 Segmental and somatic dysfunction of lumbar region: Secondary | ICD-10-CM | POA: Diagnosis not present

## 2022-02-18 DIAGNOSIS — M9904 Segmental and somatic dysfunction of sacral region: Secondary | ICD-10-CM | POA: Diagnosis not present

## 2022-02-18 DIAGNOSIS — M9903 Segmental and somatic dysfunction of lumbar region: Secondary | ICD-10-CM | POA: Diagnosis not present

## 2022-02-18 DIAGNOSIS — M9902 Segmental and somatic dysfunction of thoracic region: Secondary | ICD-10-CM | POA: Diagnosis not present

## 2022-02-18 DIAGNOSIS — M5417 Radiculopathy, lumbosacral region: Secondary | ICD-10-CM | POA: Diagnosis not present

## 2022-02-26 DIAGNOSIS — M9902 Segmental and somatic dysfunction of thoracic region: Secondary | ICD-10-CM | POA: Diagnosis not present

## 2022-02-26 DIAGNOSIS — M5417 Radiculopathy, lumbosacral region: Secondary | ICD-10-CM | POA: Diagnosis not present

## 2022-02-26 DIAGNOSIS — M9904 Segmental and somatic dysfunction of sacral region: Secondary | ICD-10-CM | POA: Diagnosis not present

## 2022-02-26 DIAGNOSIS — M9903 Segmental and somatic dysfunction of lumbar region: Secondary | ICD-10-CM | POA: Diagnosis not present

## 2022-03-12 DIAGNOSIS — M5417 Radiculopathy, lumbosacral region: Secondary | ICD-10-CM | POA: Diagnosis not present

## 2022-03-12 DIAGNOSIS — M9904 Segmental and somatic dysfunction of sacral region: Secondary | ICD-10-CM | POA: Diagnosis not present

## 2022-03-12 DIAGNOSIS — M9902 Segmental and somatic dysfunction of thoracic region: Secondary | ICD-10-CM | POA: Diagnosis not present

## 2022-03-12 DIAGNOSIS — M9903 Segmental and somatic dysfunction of lumbar region: Secondary | ICD-10-CM | POA: Diagnosis not present

## 2022-03-26 DIAGNOSIS — M9904 Segmental and somatic dysfunction of sacral region: Secondary | ICD-10-CM | POA: Diagnosis not present

## 2022-03-26 DIAGNOSIS — M5417 Radiculopathy, lumbosacral region: Secondary | ICD-10-CM | POA: Diagnosis not present

## 2022-03-26 DIAGNOSIS — M9903 Segmental and somatic dysfunction of lumbar region: Secondary | ICD-10-CM | POA: Diagnosis not present

## 2022-03-26 DIAGNOSIS — M9902 Segmental and somatic dysfunction of thoracic region: Secondary | ICD-10-CM | POA: Diagnosis not present

## 2022-04-08 DIAGNOSIS — M9903 Segmental and somatic dysfunction of lumbar region: Secondary | ICD-10-CM | POA: Diagnosis not present

## 2022-04-08 DIAGNOSIS — M9904 Segmental and somatic dysfunction of sacral region: Secondary | ICD-10-CM | POA: Diagnosis not present

## 2022-04-08 DIAGNOSIS — M9902 Segmental and somatic dysfunction of thoracic region: Secondary | ICD-10-CM | POA: Diagnosis not present

## 2022-04-08 DIAGNOSIS — M5417 Radiculopathy, lumbosacral region: Secondary | ICD-10-CM | POA: Diagnosis not present

## 2022-04-14 DIAGNOSIS — M5417 Radiculopathy, lumbosacral region: Secondary | ICD-10-CM | POA: Diagnosis not present

## 2022-04-14 DIAGNOSIS — M9902 Segmental and somatic dysfunction of thoracic region: Secondary | ICD-10-CM | POA: Diagnosis not present

## 2022-04-14 DIAGNOSIS — M9904 Segmental and somatic dysfunction of sacral region: Secondary | ICD-10-CM | POA: Diagnosis not present

## 2022-04-14 DIAGNOSIS — M9903 Segmental and somatic dysfunction of lumbar region: Secondary | ICD-10-CM | POA: Diagnosis not present

## 2022-04-23 DIAGNOSIS — M9903 Segmental and somatic dysfunction of lumbar region: Secondary | ICD-10-CM | POA: Diagnosis not present

## 2022-04-23 DIAGNOSIS — M9902 Segmental and somatic dysfunction of thoracic region: Secondary | ICD-10-CM | POA: Diagnosis not present

## 2022-04-23 DIAGNOSIS — M9904 Segmental and somatic dysfunction of sacral region: Secondary | ICD-10-CM | POA: Diagnosis not present

## 2022-04-23 DIAGNOSIS — M5417 Radiculopathy, lumbosacral region: Secondary | ICD-10-CM | POA: Diagnosis not present

## 2022-04-24 ENCOUNTER — Ambulatory Visit
Admission: EM | Admit: 2022-04-24 | Discharge: 2022-04-24 | Disposition: A | Discharge status: Home / Self Care | Payer: 59 | Attending: Urgent Care | Admitting: Urgent Care

## 2022-04-24 ENCOUNTER — Other Ambulatory Visit (HOSPITAL_BASED_OUTPATIENT_CLINIC_OR_DEPARTMENT_OTHER): Payer: Self-pay

## 2022-04-24 DIAGNOSIS — R42 Dizziness and giddiness: Secondary | ICD-10-CM | POA: Diagnosis not present

## 2022-04-24 DIAGNOSIS — H6991 Unspecified Eustachian tube disorder, right ear: Secondary | ICD-10-CM | POA: Diagnosis not present

## 2022-04-24 MED ORDER — DIAZEPAM 2 MG PO TABS
ORAL_TABLET | ORAL | 0 refills | Status: DC
  Filled 2022-04-24: qty 5, 2d supply, fill #0

## 2022-04-24 MED ORDER — MECLIZINE HCL 12.5 MG PO TABS
12.5000 mg | ORAL_TABLET | Freq: Three times a day (TID) | ORAL | 0 refills | Status: DC | PRN
  Filled 2022-04-24: qty 30, 10d supply, fill #0

## 2022-04-24 NOTE — ED Triage Notes (Signed)
Pt c/o ringing in right ear and hearing loss, "full and pressure"-sx started 2 days ago-vertigo and vomited x today-NAD-steady gait

## 2022-04-24 NOTE — ED Provider Notes (Signed)
[facility]  Note:  This document was prepared using Systems analyst and may include unintentional dictation errors.  MRN: [id number] DOB: 23-Mar-1975  Subjective:   [name] is a 47 y.o. female presenting for 2-day history of acute onset right ear ringing, fullness and pressure, vertigo and dizziness.  Had an episode of vomiting today as well.  No fever, ear drainage, runny or stuffy nose.  She did try an antihistamine but this did not help.  No history of ear infections but she would like to make sure about this.  No current facility-administered medications for this encounter.  Current Outpatient Medications:    Blood Pressure Monitoring (OMRON 3 SERIES BP MONITOR) DEVI, Use as directed, Disp: 1 each, Rfl: 0   ibuprofen (ADVIL) 400 MG tablet, Take 400 mg by mouth every 6 (six) hours as needed., Disp: , Rfl:    influenza vac split quadrivalent PF (FLUARIX QUADRIVALENT) 0.5 ML injection, Inject into the muscle., Disp: 0.5 mL, Rfl: 0   ondansetron (ZOFRAN) 4 MG tablet, Take 1 tablet (4 mg total) by mouth as directed for 2 doses. Take 1 tablet ('4mg'$  total) 30-60 minutes before each prep dose., Disp: 2 tablet, Rfl: 0   Allergies  Allergen Reactions   Tdap [Tetanus-Diphth-Acell Pertussis] Swelling    "Lot of swelling and scar tissue at injection site"    Past Medical History:  Diagnosis Date   Acute medial meniscus tear of right knee    H/O pyelonephritis 2001 & 2009     Past Surgical History:  Procedure Laterality Date   APPENDECTOMY  age 66   KNEE ARTHROSCOPY Right 06/08/2015   Procedure: RIGHT KNEE ARTHROSCOPY WITH DEBRIDEMENT;  Surgeon: Frederik Pear, MD;  Location: [facility];  Service: Orthopedics;  Laterality: Right;    Family History  Problem Relation Age of Onset   Hyperlipidemia Mother    Heart attack Father        cabg x 6   Hypertension Father    Thyroid disease Father        hypethyroidism    Hyperlipidemia Maternal Grandmother    Cancer Maternal Grandmother        breast   Hyperlipidemia Maternal Grandfather    Hypertension Maternal Grandfather    Heart attack Maternal Grandfather    Cancer Maternal Grandfather        prostate   Heart disease Maternal Grandfather    Stroke Maternal Grandfather    Diabetes Paternal Grandmother    Arthritis Paternal Grandmother    Heart attack Paternal Grandfather    Asthma Neg Hx    Alcohol abuse Neg Hx    Drug abuse Neg Hx    Colon cancer Neg Hx    Colon polyps Neg Hx    Esophageal cancer Neg Hx    Stomach cancer Neg Hx    Rectal cancer Neg Hx     Social History   Tobacco Use   Smoking status: Never   Smokeless tobacco: Never  Vaping Use   Vaping Use: Never used  Substance Use Topics   Alcohol use: Yes    Alcohol/week: 2.0 standard drinks of alcohol    Types: 2 Glasses of wine per week    Comment: social   Drug use: Never    ROS   Objective:   Vitals: BP (!) 137/90 (BP Location: Right Arm)   Pulse 63   Temp 98.2 F (36.8 C) (Oral)   Resp 18   LMP  03/17/2022   SpO2 97%   Physical Exam Constitutional:      General: She is not in acute distress.    Appearance: Normal appearance. She is well-developed. She is not ill-appearing, toxic-appearing or diaphoretic.  HENT:     Head: Normocephalic and atraumatic.     Right Ear: Tympanic membrane, ear canal and external ear normal. No tenderness. There is no impacted cerumen. Tympanic membrane is not injected, perforated, erythematous or bulging.     Left Ear: Tympanic membrane, ear canal and external ear normal. No tenderness. There is no impacted cerumen. Tympanic membrane is not injected, perforated, erythematous or bulging.     Nose: Nose normal.     Mouth/Throat:     Mouth: Mucous membranes are moist.  Eyes:     General: No scleral icterus.       Right eye: No discharge.        Left eye: No discharge.     Extraocular Movements: Extraocular movements intact.   Cardiovascular:     Rate and Rhythm: Normal rate.  Pulmonary:     Effort: Pulmonary effort is normal.  Skin:    General: Skin is warm and dry.  Neurological:     General: No focal deficit present.     Mental Status: She is alert and oriented to person, place, and time.  Psychiatric:        Mood and Affect: Mood normal.        Behavior: Behavior normal.     Assessment and Plan :   PDMP not reviewed this encounter.  1. Vertigo   2. Eustachian tube dysfunction, right     Low suspicion for an acute encephalopathy as an underlying source of the vertigo.  Recommended conservative management.  Will try to use Valium once or twice to control vertigo.  This can be followed by concurrent use of meclizine.  Consider using Flonase, Zyrtec, pseudoephedrine as needed for eustachian tube dysfunction. Counseled patient on potential for adverse effects with medications prescribed/recommended today, ER and return-to-clinic precautions discussed, patient verbalized understanding.    Jaynee Eagles, PA-C 04/24/22 1728

## 2022-05-01 DIAGNOSIS — M9902 Segmental and somatic dysfunction of thoracic region: Secondary | ICD-10-CM | POA: Diagnosis not present

## 2022-05-01 DIAGNOSIS — M9903 Segmental and somatic dysfunction of lumbar region: Secondary | ICD-10-CM | POA: Diagnosis not present

## 2022-05-01 DIAGNOSIS — M9904 Segmental and somatic dysfunction of sacral region: Secondary | ICD-10-CM | POA: Diagnosis not present

## 2022-05-01 DIAGNOSIS — M5417 Radiculopathy, lumbosacral region: Secondary | ICD-10-CM | POA: Diagnosis not present

## 2022-05-08 DIAGNOSIS — M9904 Segmental and somatic dysfunction of sacral region: Secondary | ICD-10-CM | POA: Diagnosis not present

## 2022-05-08 DIAGNOSIS — M5417 Radiculopathy, lumbosacral region: Secondary | ICD-10-CM | POA: Diagnosis not present

## 2022-05-08 DIAGNOSIS — M9903 Segmental and somatic dysfunction of lumbar region: Secondary | ICD-10-CM | POA: Diagnosis not present

## 2022-05-08 DIAGNOSIS — M9902 Segmental and somatic dysfunction of thoracic region: Secondary | ICD-10-CM | POA: Diagnosis not present

## 2022-05-12 ENCOUNTER — Ambulatory Visit (INDEPENDENT_AMBULATORY_CARE_PROVIDER_SITE_OTHER): Payer: 59 | Admitting: Sports Medicine

## 2022-05-12 ENCOUNTER — Other Ambulatory Visit (HOSPITAL_BASED_OUTPATIENT_CLINIC_OR_DEPARTMENT_OTHER): Payer: Self-pay

## 2022-05-12 VITALS — BP 150/91 | HR 68 | Wt 134.0 lb

## 2022-05-12 DIAGNOSIS — H905 Unspecified sensorineural hearing loss: Secondary | ICD-10-CM | POA: Insufficient documentation

## 2022-05-12 DIAGNOSIS — E785 Hyperlipidemia, unspecified: Secondary | ICD-10-CM

## 2022-05-12 DIAGNOSIS — H9192 Unspecified hearing loss, left ear: Secondary | ICD-10-CM

## 2022-05-12 MED ORDER — AZITHROMYCIN 250 MG PO TABS
ORAL_TABLET | ORAL | 0 refills | Status: DC
  Filled 2022-05-12: qty 6, 5d supply, fill #0

## 2022-05-12 MED ORDER — PREDNISONE 50 MG PO TABS
50.0000 mg | ORAL_TABLET | Freq: Every day | ORAL | 0 refills | Status: DC
Start: 2022-05-12 — End: 2022-06-24
  Filled 2022-05-12: qty 5, 5d supply, fill #0

## 2022-05-12 NOTE — Assessment & Plan Note (Signed)
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 She was seen in the emergency department and diagnosed with eustachian tube dysfunction, she was given some nasal steroids, decongestants. Her vertigo has improved considerably but is still present, unfortunately she continues to have significant hearing loss on the right, as well as tinnitus on the right. Her neurologic exam today was normal, cranial nerves II through XII are intact with the exception of cranial nerve VIII, motor, sensory, coordination functions are all intact. Oropharyngeal exam unremarkable, she had potentially a right middle ear effusion on exam but this is a soft call. Weber's test localizes to the normal ear on the left, the Rinne test is mostly normal, air conduction is better than bone conduction right and left. All of this points to sensorineural rather than conductive hearing loss. This points more towards acute labyrinthitis or Mnire's disease rather than eustachian tube dysfunction. Adding 5 days of steroids, azithromycin, she can continue her Flonase. Will do this for about 3 weeks, if she does not improve considerably we will get a brain MRI with and without contrast and consultation with ENT.

## 2022-05-12 NOTE — Progress Notes (Signed)
    Procedures performed today:    None.  Independent interpretation of notes and tests performed by another provider:   None.  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 She was seen in the emergency department and diagnosed with eustachian tube dysfunction, she was given some nasal steroids, decongestants. Her vertigo has improved considerably but is still present, unfortunately she continues to have significant hearing loss on the right, as well as tinnitus on the right. Her neurologic exam today was normal, cranial nerves II through XII are intact with the exception of cranial nerve VIII, motor, sensory, coordination functions are all intact. Oropharyngeal exam unremarkable, she had potentially a right middle ear effusion on exam but this is a soft call. Weber's test localizes to the normal ear on the left, the Rinne test is mostly normal, air conduction is better than bone conduction right and left. All of this points to sensorineural rather than conductive hearing loss. This points more towards acute labyrinthitis or Mnire's disease rather than eustachian tube dysfunction. Adding 5 days of steroids, azithromycin, she can continue her Flonase. Will do this for about 3 weeks, if she does not improve considerably we will get a brain MRI with and without contrast and consultation with ENT.    ____________________________________________ Gwen Her. Dianah Field, M.D., ABFM., CAQSM., AME. Primary Care and Sports Medicine [facility] [facility]  Adjunct Professor of [place] [employer] [facility] of Medicine  Risk manager

## 2022-05-19 ENCOUNTER — Encounter: Payer: Self-pay | Admitting: Sports Medicine

## 2022-05-20 ENCOUNTER — Other Ambulatory Visit (HOSPITAL_BASED_OUTPATIENT_CLINIC_OR_DEPARTMENT_OTHER): Payer: Self-pay

## 2022-05-20 ENCOUNTER — Ambulatory Visit (INDEPENDENT_AMBULATORY_CARE_PROVIDER_SITE_OTHER): Payer: 59

## 2022-05-20 ENCOUNTER — Ambulatory Visit (INDEPENDENT_AMBULATORY_CARE_PROVIDER_SITE_OTHER): Payer: 59 | Admitting: Sports Medicine

## 2022-05-20 ENCOUNTER — Encounter: Payer: Self-pay | Admitting: Sports Medicine

## 2022-05-20 DIAGNOSIS — M9903 Segmental and somatic dysfunction of lumbar region: Secondary | ICD-10-CM | POA: Diagnosis not present

## 2022-05-20 DIAGNOSIS — H9042 Sensorineural hearing loss, unilateral, left ear, with unrestricted hearing on the contralateral side: Secondary | ICD-10-CM | POA: Diagnosis not present

## 2022-05-20 DIAGNOSIS — M9904 Segmental and somatic dysfunction of sacral region: Secondary | ICD-10-CM | POA: Diagnosis not present

## 2022-05-20 DIAGNOSIS — M5417 Radiculopathy, lumbosacral region: Secondary | ICD-10-CM | POA: Diagnosis not present

## 2022-05-20 DIAGNOSIS — M9902 Segmental and somatic dysfunction of thoracic region: Secondary | ICD-10-CM | POA: Diagnosis not present

## 2022-05-20 DIAGNOSIS — R29818 Other symptoms and signs involving the nervous system: Secondary | ICD-10-CM | POA: Diagnosis not present

## 2022-05-20 MED ORDER — TRIAMTERENE-HCTZ 37.5-25 MG PO CAPS
1.0000 | ORAL_CAPSULE | Freq: Every day | ORAL | 3 refills | Status: DC
Start: 2022-05-20 — End: 2022-09-11
  Filled 2022-05-20: qty 30, 30d supply, fill #0
  Filled 2022-06-16: qty 90, 90d supply, fill #1

## 2022-05-20 MED ORDER — GADOBUTROL 1 MMOL/ML IV SOLN
6.0000 mL | Freq: Once | INTRAVENOUS | Status: AC | PRN
  Administered 2022-05-20: 6 mL via INTRAVENOUS

## 2022-05-20 MED ORDER — DIAZEPAM 5 MG PO TABS
5.0000 mg | ORAL_TABLET | Freq: Three times a day (TID) | ORAL | 0 refills | Status: AC | PRN
  Filled 2022-05-20: qty 30, 10d supply, fill #0

## 2022-05-20 NOTE — Assessment & Plan Note (Signed)
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 Seen in the emergency department, diagnosed with eustachian tube dysfunction, given nasal steroids and decongestants, no improvement, I saw her about a week and a half ago, she still had vertigo, hearing loss, tinnitus, neurologic exam was normal with the exception of a cranial nerve VIII deficit. Weber's test localized to the normal ear on the left, Rinnes test was normal, air conduction better than bone conduction right and left. We suspected more of a sensorineural hearing loss rather than conductive hearing loss, with symptoms putting more towards acute labyrinthitis or Mnire's disease. To complete the initial treatment we did 5 days of steroids, azithromycin, continue Flonase. Unfortunately she has not improved at all so we will proceed with brain MRI with and without contrast, of the 8th cranial nerve and internal acoustic meatus, Valium, Dyazide. I would also like an urgent consultation with ENT. Return to see me when workup is complete.

## 2022-05-20 NOTE — Progress Notes (Signed)
    Procedures performed today:    None.  Independent interpretation of notes and tests performed by another provider:   None.  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 Seen in the emergency department, diagnosed with eustachian tube dysfunction, given nasal steroids and decongestants, no improvement, I saw her about a week and a half ago, she still had vertigo, hearing loss, tinnitus, neurologic exam was normal with the exception of a cranial nerve VIII deficit. Weber's test localized to the normal ear on the left, Rinnes test was normal, air conduction better than bone conduction right and left. We suspected more of a sensorineural hearing loss rather than conductive hearing loss, with symptoms putting more towards acute labyrinthitis or Mnire's disease. To complete the initial treatment we did 5 days of steroids, azithromycin, continue Flonase. Unfortunately she has not improved at all so we will proceed with brain MRI with and without contrast, of the 8th cranial nerve and internal acoustic meatus, Valium, Dyazide. I would also like an urgent consultation with ENT. Return to see me when workup is complete.    ____________________________________________ Gwen Her. Dianah Field, M.D., ABFM., CAQSM., AME. Primary Care and Sports Medicine [place] [facility]  Adjunct Professor of [place] [employer] [facility] of Medicine  Risk manager

## 2022-05-23 ENCOUNTER — Other Ambulatory Visit (HOSPITAL_BASED_OUTPATIENT_CLINIC_OR_DEPARTMENT_OTHER): Payer: Self-pay

## 2022-05-26 ENCOUNTER — Encounter: Payer: Self-pay | Admitting: Sports Medicine

## 2022-06-09 ENCOUNTER — Ambulatory Visit: Payer: 59 | Admitting: Sports Medicine

## 2022-06-16 ENCOUNTER — Other Ambulatory Visit: Payer: Self-pay

## 2022-06-19 ENCOUNTER — Other Ambulatory Visit: Payer: Self-pay | Admitting: Sports Medicine

## 2022-06-19 DIAGNOSIS — E785 Hyperlipidemia, unspecified: Secondary | ICD-10-CM | POA: Diagnosis not present

## 2022-06-20 LAB — COMPREHENSIVE METABOLIC PANEL
ALT: 19 IU/L (ref 0–32)
AST: 23 IU/L (ref 0–40)
Albumin/Globulin Ratio: 2 (ref 1.2–2.2)
Albumin: 4.5 g/dL (ref 3.9–4.9)
Alkaline Phosphatase: 73 IU/L (ref 44–121)
BUN/Creatinine Ratio: 24 — ABNORMAL HIGH (ref 9–23)
BUN: 22 mg/dL (ref 6–24)
Bilirubin Total: 0.7 mg/dL (ref 0.0–1.2)
CO2: 24 mmol/L (ref 20–29)
Calcium: 10.2 mg/dL (ref 8.7–10.2)
Chloride: 96 mmol/L (ref 96–106)
Creatinine, Ser: 0.91 mg/dL (ref 0.57–1.00)
Globulin, Total: 2.3 g/dL (ref 1.5–4.5)
Glucose: 89 mg/dL (ref 70–99)
Potassium: 3.8 mmol/L (ref 3.5–5.2)
Sodium: 136 mmol/L (ref 134–144)
Total Protein: 6.8 g/dL (ref 6.0–8.5)
eGFR: 78 mL/min/{1.73_m2} (ref >59)

## 2022-06-20 LAB — LIPID PANEL
Chol/HDL Ratio: 2.4 ratio (ref 0.0–4.4)
Cholesterol, Total: 218 mg/dL — ABNORMAL HIGH (ref 100–199)
HDL: 89 mg/dL (ref >39)
LDL Chol Calc (NIH): 122 mg/dL — ABNORMAL HIGH (ref 0–99)
Triglycerides: 38 mg/dL (ref 0–149)
VLDL Cholesterol Cal: 7 mg/dL (ref 5–40)

## 2022-06-20 LAB — CBC
Hematocrit: 40.4 % (ref 34.0–46.6)
Hemoglobin: 14.1 g/dL (ref 11.1–15.9)
MCH: 33 pg (ref 26.6–33.0)
MCHC: 34.9 g/dL (ref 31.5–35.7)
MCV: 95 fL (ref 79–97)
Platelets: 248 10*3/uL (ref 150–450)
RBC: 4.27 x10E6/uL (ref 3.77–5.28)
RDW: 12 % (ref 11.7–15.4)
WBC: 5.9 10*3/uL (ref 3.4–10.8)

## 2022-06-20 LAB — HEMOGLOBIN A1C
Est. average glucose Bld gHb Est-mCnc: 111 mg/dL
Hgb A1c MFr Bld: 5.5 % (ref 4.8–5.6)

## 2022-06-20 LAB — TSH: TSH: 1.27 u[IU]/mL (ref 0.450–4.500)

## 2022-06-24 ENCOUNTER — Encounter: Payer: Self-pay | Admitting: Sports Medicine

## 2022-06-24 ENCOUNTER — Ambulatory Visit (INDEPENDENT_AMBULATORY_CARE_PROVIDER_SITE_OTHER): Payer: 59 | Admitting: Sports Medicine

## 2022-06-24 ENCOUNTER — Other Ambulatory Visit (HOSPITAL_BASED_OUTPATIENT_CLINIC_OR_DEPARTMENT_OTHER): Payer: Self-pay

## 2022-06-24 VITALS — BP 149/90 | HR 85 | Ht 65.0 in | Wt 133.0 lb

## 2022-06-24 DIAGNOSIS — H9042 Sensorineural hearing loss, unilateral, left ear, with unrestricted hearing on the contralateral side: Secondary | ICD-10-CM | POA: Diagnosis not present

## 2022-06-24 DIAGNOSIS — Z0000 Encounter for general adult medical examination without abnormal findings: Secondary | ICD-10-CM

## 2022-06-24 DIAGNOSIS — E785 Hyperlipidemia, unspecified: Secondary | ICD-10-CM | POA: Diagnosis not present

## 2022-06-24 DIAGNOSIS — R030 Elevated blood-pressure reading, without diagnosis of hypertension: Secondary | ICD-10-CM

## 2022-06-24 MED ORDER — PROPRANOLOL HCL 10 MG PO TABS
40.0000 mg | ORAL_TABLET | Freq: Every day | ORAL | 11 refills | Status: AC
  Filled 2022-06-24: qty 20, 5d supply, fill #0
  Filled 2022-09-29: qty 20, 5d supply, fill #1
  Filled 2023-01-19: qty 20, 5d supply, fill #2

## 2022-06-24 NOTE — Patient Instructions (Signed)

## 2022-06-24 NOTE — Assessment & Plan Note (Signed)
Blood pressure readings in the office tend to be elevated but they are normal at home, we will continue monitoring this.

## 2022-06-24 NOTE — Progress Notes (Signed)
Subjective:    CC: Annual Physical Exam  HPI:  This patient is here for their annual physical  I reviewed the past medical history, family history, social history, surgical history, and allergies today and no changes were needed.  Please see the problem list section below in epic for further details.  Past Medical History: Past Medical History:  Diagnosis Date   Acute medial meniscus tear of right knee    H/O pyelonephritis 2001 & 2009   Past Surgical History: Past Surgical History:  Procedure Laterality Date   APPENDECTOMY  age 60   KNEE ARTHROSCOPY Right 06/08/2015   Procedure: RIGHT KNEE ARTHROSCOPY WITH DEBRIDEMENT;  Surgeon: Gean Birchwood, MD;  Location: [facility];  Service: Orthopedics;  Laterality: Right;   Social History: Social History   Socioeconomic History   Marital status: Married    Spouse name: Not on file   Number of children: 2   Years of education: Not on file   Highest education level: Not on file  Occupational History   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[employer]    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, 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 [employer]    Employer: [employer]  Tobacco Use   Smoking status: Never   Smokeless tobacco: Never  Vaping Use   Vaping Use: Never used  Substance and Sexual Activity   Alcohol use: Yes    Alcohol/week: 2.0 standard drinks of alcohol    Types: 2 Glasses of wine per week    Comment: social   Drug use: Never   Sexual activity: Yes    Partners: Male    Birth control/protection: Surgical  Other Topics Concern   Not on file  Social History Narrative   Married   2 children- 2009- daughter   2012 son   Teacher, 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    Social Determinants of Health   Financial Resource Strain: Not on file  Food Insecurity: Not on file  Transportation Needs: Not on file  Physical Activity: Not on file  Stress: Not on  file  Social Connections: Not on file   Family History: Family History  Problem Relation Age of Onset   Hyperlipidemia Mother    Heart attack Father        cabg x 6   Hypertension Father    Thyroid disease Father        hypethyroidism   Hyperlipidemia Maternal Grandmother    Cancer Maternal Grandmother        breast   Hyperlipidemia Maternal Grandfather    Hypertension Maternal Grandfather    Heart attack Maternal Grandfather    Cancer Maternal Grandfather        prostate   Heart disease Maternal Grandfather    Stroke Maternal Grandfather    Diabetes Paternal Grandmother    Arthritis Paternal Grandmother    Heart attack Paternal Grandfather    Asthma Neg Hx    Alcohol abuse Neg Hx    Drug abuse Neg Hx    Colon cancer Neg Hx    Colon polyps Neg Hx    Esophageal cancer Neg Hx    Stomach cancer Neg Hx    Rectal cancer Neg Hx    Allergies: Allergies  Allergen Reactions   Tdap [Tetanus-Diphth-Acell Pertussis] Swelling    "Lot of swelling and scar tissue at injection site"   Medications: See med rec.  Review of Systems: No headache,  visual changes, nausea, vomiting, diarrhea, constipation, dizziness, abdominal pain, skin rash, fevers, chills, night sweats, swollen lymph nodes, weight loss, chest pain, body aches, joint swelling, muscle aches, shortness of breath, mood changes, visual or auditory hallucinations.  Objective:    General: Well Developed, well nourished, and in no acute distress.  Neuro: Alert and oriented x3, extra-ocular muscles intact, sensation grossly intact. Cranial nerves II through XII are intact, motor, sensory, and coordinative functions are all intact. HEENT: Normocephalic, atraumatic, pupils equal round reactive to light, neck supple, no masses, no lymphadenopathy, thyroid nonpalpable. Oropharynx, nasopharynx, external ear canals are unremarkable. Skin: Warm and dry, no rashes noted.  Cardiac: Regular rate and rhythm, no murmurs rubs or gallops.   Respiratory: Clear to auscultation bilaterally. Not using accessory muscles, speaking in full sentences.  Abdominal: Soft, nontender, nondistended, positive bowel sounds, no masses, no organomegaly.  Musculoskeletal: Shoulder, elbow, wrist, hip, knee, ankle stable, and with full range of motion.  Impression and Recommendations:    The patient was counselled, risk factors were discussed, anticipatory guidance given.  Annual physical exam Had a prior reaction to tetanus so we will hold off on this, up-to-date on colon cancer screening, cervical cancer screening, breast cancer screening. We went over her labs today. She is healthy, she exercises every day at 5 AM. 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   Elevated blood pressure reading Blood pressure readings in the office tend to be elevated but they are normal at home, we will continue monitoring this.  Hyperlipidemia Mild hyperlipidemia, work on [employer]. We can recheck this at the 1 year follow-up.  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 She was seen in the emergency department and diagnosed with eustachian tube dysfunction, given nasal steroids and decongestants, unfortunately no improvement. She had vertigo on exam, hearing loss, tinnitus, neurologic exam was normal with the exception of cranial nerve VIII deficit with Weber's test localizing to the normal ear on the left, Rinne test was normal, air conduction better than bone conduction on the right and the left pointing more towards a sensorineural hearing loss. My suspicion was acute labyrinthitis or  Mnire's disease. We did steroids, azithromycin, continued Flonase, no improvement, I obtained an MRI with and without contrast of the 8th cranial nerve and internal acoustic meatus, this was negative. We added Valium and Dyazide, she does not really need Valium that much anymore, Dyazide has provided good improvement in her symptoms, she has an appointment coming up with ENT, Dr. Suszanne Conners. We will watch this from afar.   ____________________________________________ Ihor Austin. Benjamin Stain, M.D., ABFM., CAQSM., AME. Primary Care and Sports Medicine  [facility]  Adjunct Professor of Family Medicine  [place] [employer] [facility] [employer]  Restaurant manager, fast food

## 2022-06-24 NOTE — Assessment & Plan Note (Addendum)
Had a prior reaction to tetanus so we will hold off on this, up-to-date on colon cancer screening, cervical cancer screening, breast cancer screening. We went over her labs today. She is healthy, she exercises every day at 5 AM. 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

## 2022-06-24 NOTE — Assessment & Plan Note (Signed)
Mild hyperlipidemia, work on [employer]. We can recheck this at the 1 year follow-up.

## 2022-06-24 NOTE — Assessment & Plan Note (Signed)
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 She was seen in the emergency department and diagnosed with eustachian tube dysfunction, given nasal steroids and decongestants, unfortunately no improvement. She had vertigo on exam, hearing loss, tinnitus, neurologic exam was normal with the exception of cranial nerve VIII deficit with Weber's test localizing to the normal ear on the left, Rinne test was normal, air conduction better than bone conduction on the right and the left pointing more towards a sensorineural hearing loss. My suspicion was acute labyrinthitis or Mnire's disease. We did steroids, azithromycin, continued Flonase, no improvement, I obtained an MRI with and without contrast of the 8th cranial nerve and internal acoustic meatus, this was negative. We added Valium and Dyazide, she does not really need Valium that much anymore, Dyazide has provided good improvement in her symptoms, she has an appointment coming up with ENT, Dr. Suszanne Conners. We will watch this from afar.

## 2022-06-25 DIAGNOSIS — M9902 Segmental and somatic dysfunction of thoracic region: Secondary | ICD-10-CM | POA: Diagnosis not present

## 2022-06-25 DIAGNOSIS — M9903 Segmental and somatic dysfunction of lumbar region: Secondary | ICD-10-CM | POA: Diagnosis not present

## 2022-06-25 DIAGNOSIS — M9904 Segmental and somatic dysfunction of sacral region: Secondary | ICD-10-CM | POA: Diagnosis not present

## 2022-06-25 DIAGNOSIS — M5417 Radiculopathy, lumbosacral region: Secondary | ICD-10-CM | POA: Diagnosis not present

## 2022-07-09 DIAGNOSIS — H9041 Sensorineural hearing loss, unilateral, right ear, with unrestricted hearing on the contralateral side: Secondary | ICD-10-CM | POA: Diagnosis not present

## 2022-07-09 DIAGNOSIS — R42 Dizziness and giddiness: Secondary | ICD-10-CM | POA: Diagnosis not present

## 2022-07-22 DIAGNOSIS — M9904 Segmental and somatic dysfunction of sacral region: Secondary | ICD-10-CM | POA: Diagnosis not present

## 2022-07-22 DIAGNOSIS — M5417 Radiculopathy, lumbosacral region: Secondary | ICD-10-CM | POA: Diagnosis not present

## 2022-07-22 DIAGNOSIS — M9902 Segmental and somatic dysfunction of thoracic region: Secondary | ICD-10-CM | POA: Diagnosis not present

## 2022-07-22 DIAGNOSIS — M9903 Segmental and somatic dysfunction of lumbar region: Secondary | ICD-10-CM | POA: Diagnosis not present

## 2022-07-31 DIAGNOSIS — R42 Dizziness and giddiness: Secondary | ICD-10-CM | POA: Diagnosis not present

## 2022-08-07 DIAGNOSIS — R42 Dizziness and giddiness: Secondary | ICD-10-CM | POA: Diagnosis not present

## 2022-08-07 DIAGNOSIS — H9041 Sensorineural hearing loss, unilateral, right ear, with unrestricted hearing on the contralateral side: Secondary | ICD-10-CM | POA: Diagnosis not present

## 2022-08-18 ENCOUNTER — Other Ambulatory Visit (HOSPITAL_BASED_OUTPATIENT_CLINIC_OR_DEPARTMENT_OTHER): Payer: Self-pay

## 2022-08-18 MED ORDER — SCOPOLAMINE 1 MG/3DAYS TD PT72
1.0000 | MEDICATED_PATCH | TRANSDERMAL | 3 refills | Status: DC | PRN
  Filled 2022-08-18: qty 4, 12d supply, fill #0

## 2022-08-19 ENCOUNTER — Other Ambulatory Visit (HOSPITAL_BASED_OUTPATIENT_CLINIC_OR_DEPARTMENT_OTHER): Payer: Self-pay

## 2022-08-20 DIAGNOSIS — M5417 Radiculopathy, lumbosacral region: Secondary | ICD-10-CM | POA: Diagnosis not present

## 2022-08-20 DIAGNOSIS — M9904 Segmental and somatic dysfunction of sacral region: Secondary | ICD-10-CM | POA: Diagnosis not present

## 2022-08-20 DIAGNOSIS — M9902 Segmental and somatic dysfunction of thoracic region: Secondary | ICD-10-CM | POA: Diagnosis not present

## 2022-08-20 DIAGNOSIS — M9903 Segmental and somatic dysfunction of lumbar region: Secondary | ICD-10-CM | POA: Diagnosis not present

## 2022-08-21 IMAGING — MG MM DIGITAL SCREENING BILAT W/ TOMO AND CAD
6 of 12 series · 6 of 36 positions shown · non-contrast
Comparison: Previous exam(s).

CLINICAL DATA: Screening.

EXAM:
DIGITAL SCREENING BILATERAL MAMMOGRAM WITH TOMOSYNTHESIS AND CAD
TECHNIQUE: Bilateral screening digital craniocaudal and mediolateral oblique
mammograms were obtained. Bilateral screening digital breast
tomosynthesis was performed. The images were evaluated with
computer-aided detection.

[L CC synth-2D]
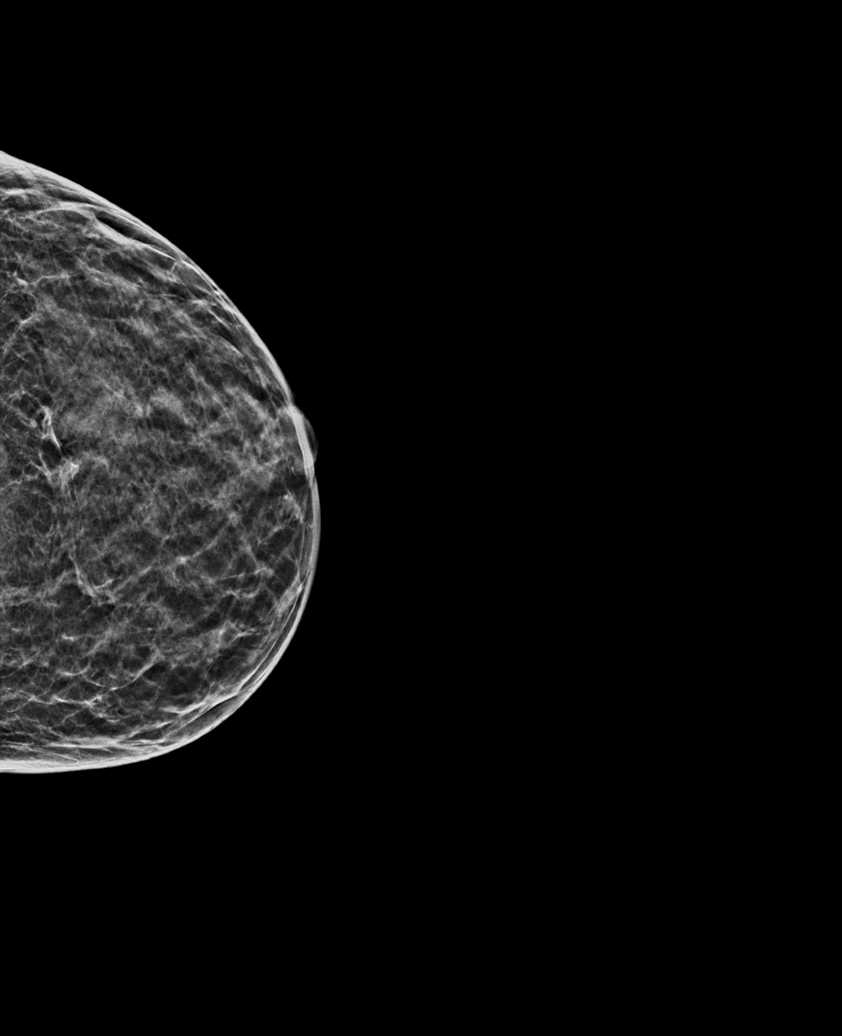

[R CC synth-2D]
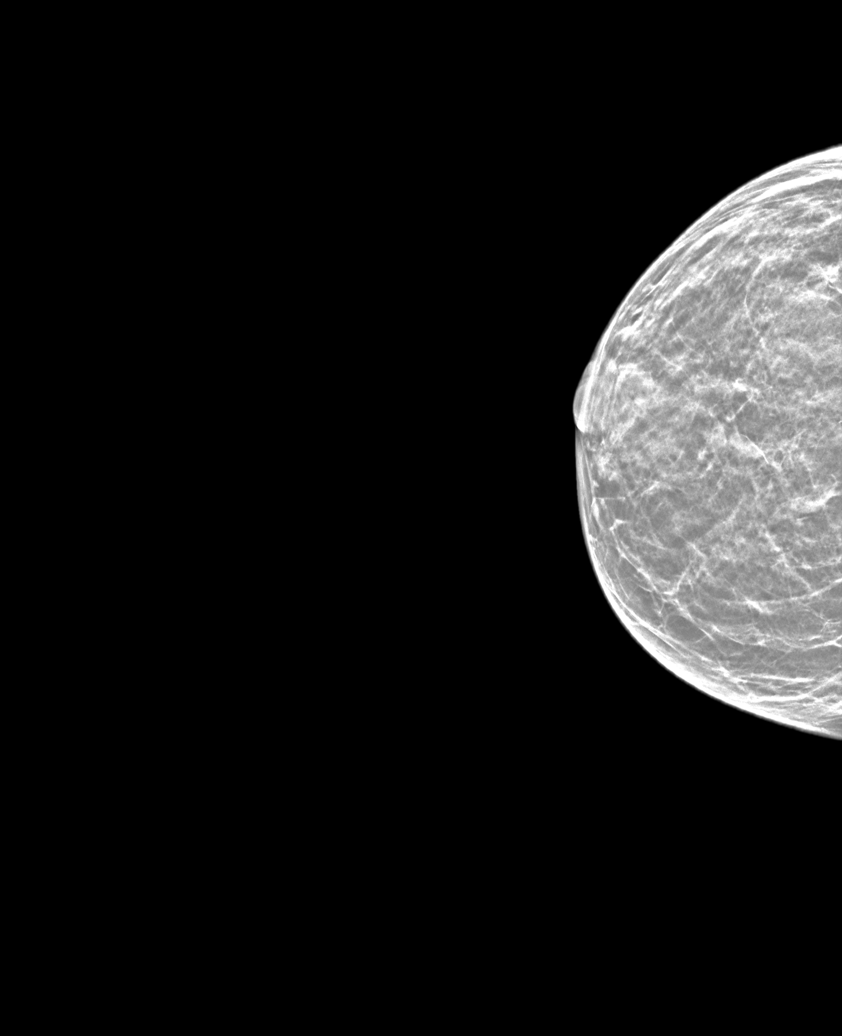

[R XCCL synth-2D]
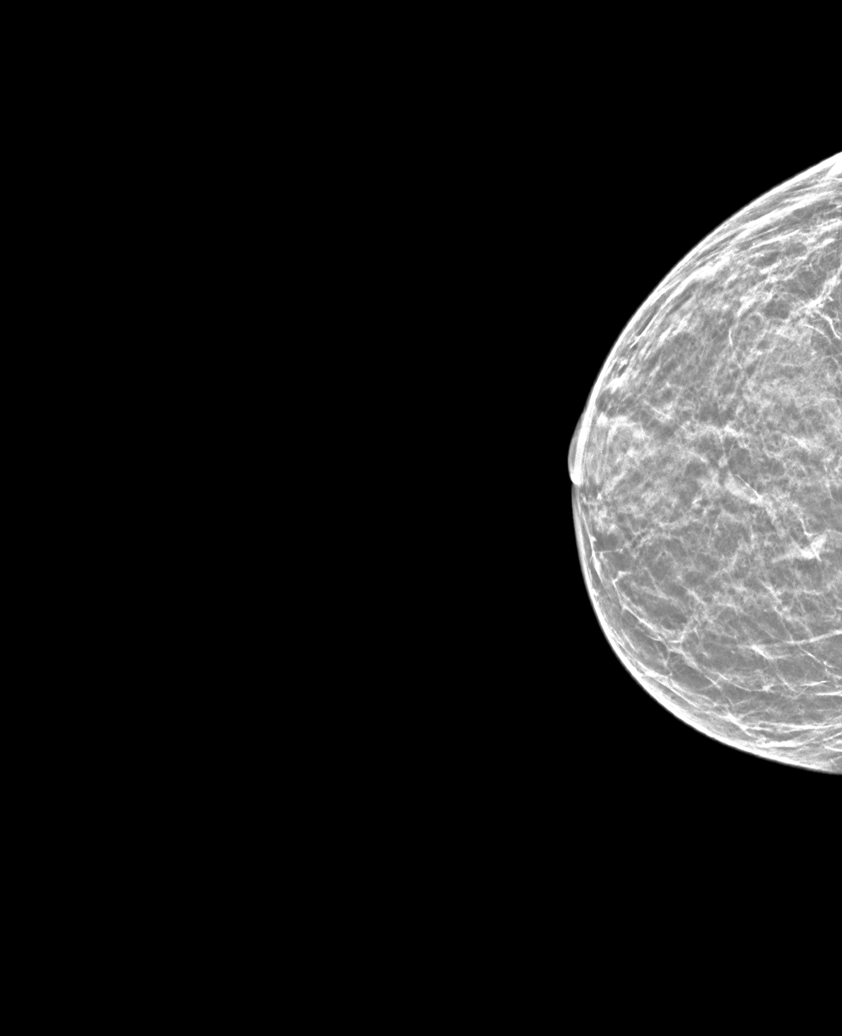

[R MLO synth-2D (1 of 2)]
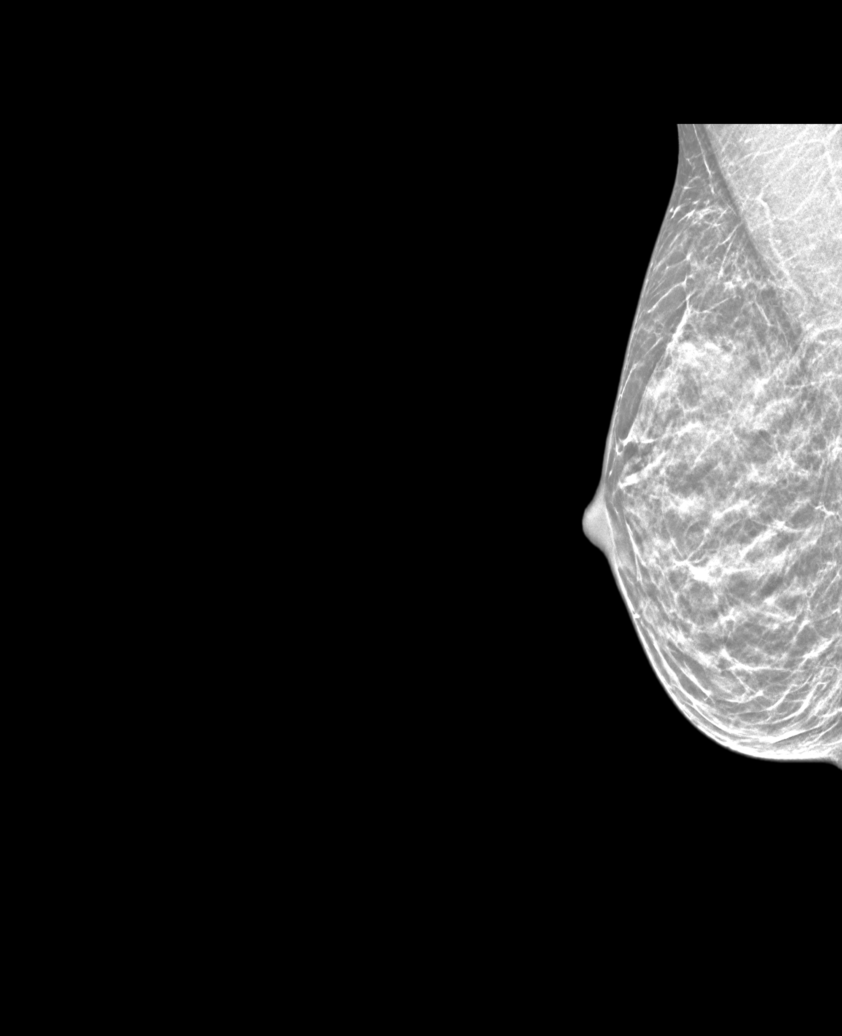

[R MLO synth-2D (2 of 2)]
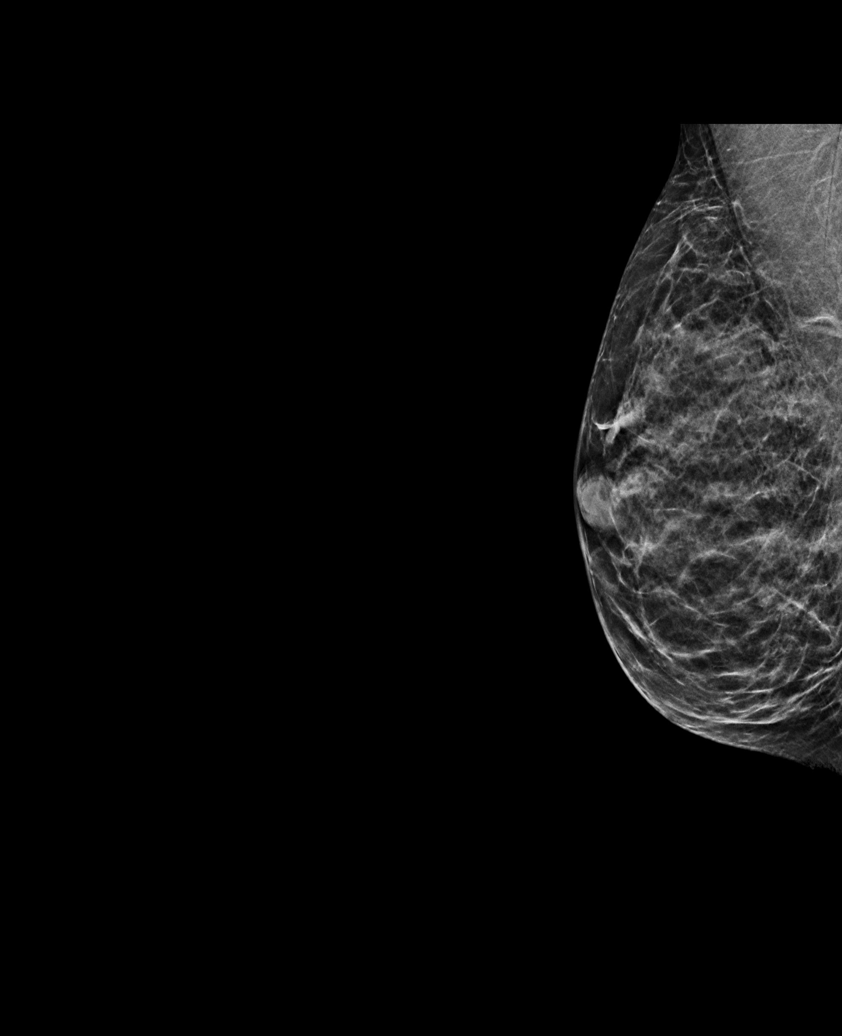

[L MLO synth-2D]
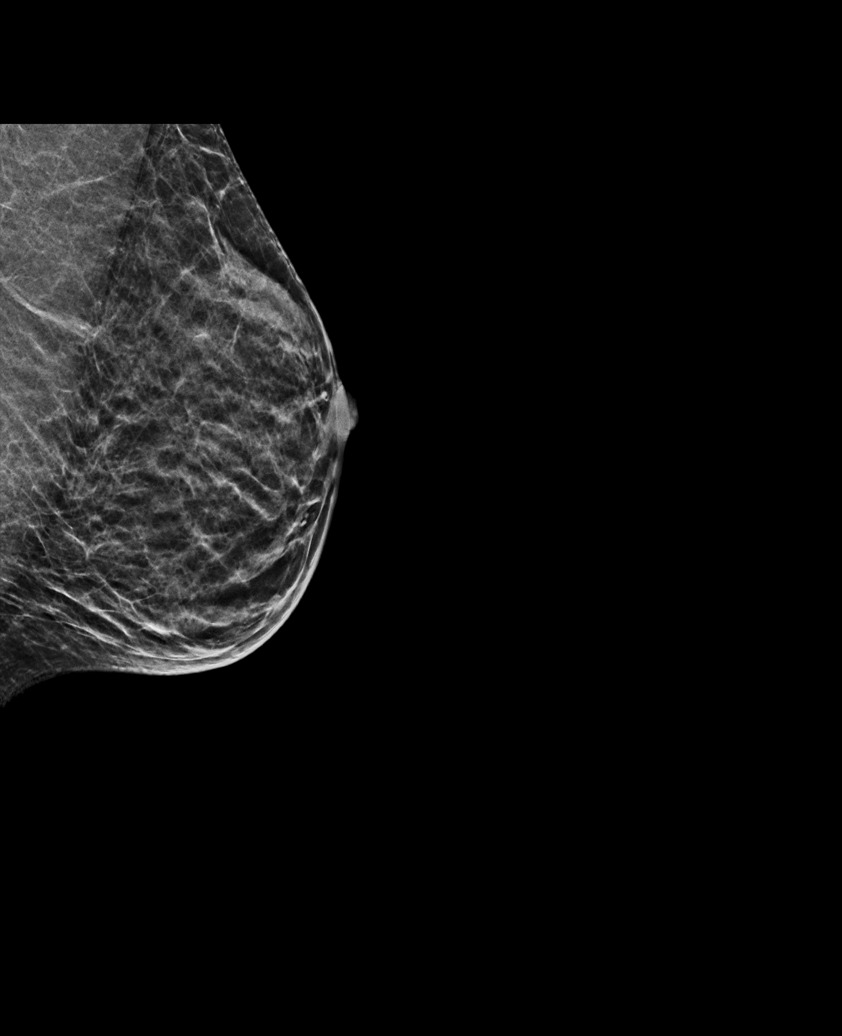

[6 of 36 positions shown; findings below may reference images not displayed]

ACR Breast Density Category c: The breast tissue is heterogeneously
dense, which may obscure small masses.
FINDINGS: There are no findings suspicious for malignancy.
IMPRESSION: No mammographic evidence of malignancy. A result letter of this
screening mammogram will be mailed directly to the patient.

RECOMMENDATION:
Screening mammogram in one year. (Code:Q3-W-BC3)

BI-RADS CATEGORY  1: Negative.

## 2022-09-08 DIAGNOSIS — M9904 Segmental and somatic dysfunction of sacral region: Secondary | ICD-10-CM | POA: Diagnosis not present

## 2022-09-08 DIAGNOSIS — M5417 Radiculopathy, lumbosacral region: Secondary | ICD-10-CM | POA: Diagnosis not present

## 2022-09-08 DIAGNOSIS — M9903 Segmental and somatic dysfunction of lumbar region: Secondary | ICD-10-CM | POA: Diagnosis not present

## 2022-09-08 DIAGNOSIS — M9902 Segmental and somatic dysfunction of thoracic region: Secondary | ICD-10-CM | POA: Diagnosis not present

## 2022-09-11 ENCOUNTER — Other Ambulatory Visit: Payer: Self-pay

## 2022-09-11 ENCOUNTER — Other Ambulatory Visit: Payer: Self-pay | Admitting: Sports Medicine

## 2022-09-11 ENCOUNTER — Other Ambulatory Visit (HOSPITAL_COMMUNITY): Payer: Self-pay

## 2022-09-11 DIAGNOSIS — H9042 Sensorineural hearing loss, unilateral, left ear, with unrestricted hearing on the contralateral side: Secondary | ICD-10-CM

## 2022-09-11 MED ORDER — TRIAMTERENE-HCTZ 37.5-25 MG PO CAPS
1.0000 | ORAL_CAPSULE | Freq: Every day | ORAL | 3 refills | Status: DC
Start: 2022-09-11 — End: 2023-01-19
  Filled 2022-09-11: qty 90, 90d supply, fill #0
  Filled 2022-12-16: qty 30, 30d supply, fill #1

## 2022-09-29 ENCOUNTER — Other Ambulatory Visit: Payer: Self-pay

## 2022-09-30 DIAGNOSIS — M5417 Radiculopathy, lumbosacral region: Secondary | ICD-10-CM | POA: Diagnosis not present

## 2022-09-30 DIAGNOSIS — M9904 Segmental and somatic dysfunction of sacral region: Secondary | ICD-10-CM | POA: Diagnosis not present

## 2022-09-30 DIAGNOSIS — M9902 Segmental and somatic dysfunction of thoracic region: Secondary | ICD-10-CM | POA: Diagnosis not present

## 2022-09-30 DIAGNOSIS — M9903 Segmental and somatic dysfunction of lumbar region: Secondary | ICD-10-CM | POA: Diagnosis not present

## 2022-10-23 DIAGNOSIS — M5417 Radiculopathy, lumbosacral region: Secondary | ICD-10-CM | POA: Diagnosis not present

## 2022-10-23 DIAGNOSIS — M9902 Segmental and somatic dysfunction of thoracic region: Secondary | ICD-10-CM | POA: Diagnosis not present

## 2022-10-23 DIAGNOSIS — M9904 Segmental and somatic dysfunction of sacral region: Secondary | ICD-10-CM | POA: Diagnosis not present

## 2022-10-23 DIAGNOSIS — M9903 Segmental and somatic dysfunction of lumbar region: Secondary | ICD-10-CM | POA: Diagnosis not present

## 2022-11-06 DIAGNOSIS — M9904 Segmental and somatic dysfunction of sacral region: Secondary | ICD-10-CM | POA: Diagnosis not present

## 2022-11-06 DIAGNOSIS — M9903 Segmental and somatic dysfunction of lumbar region: Secondary | ICD-10-CM | POA: Diagnosis not present

## 2022-11-06 DIAGNOSIS — M5417 Radiculopathy, lumbosacral region: Secondary | ICD-10-CM | POA: Diagnosis not present

## 2022-11-06 DIAGNOSIS — M9902 Segmental and somatic dysfunction of thoracic region: Secondary | ICD-10-CM | POA: Diagnosis not present

## 2022-11-11 DIAGNOSIS — H9041 Sensorineural hearing loss, unilateral, right ear, with unrestricted hearing on the contralateral side: Secondary | ICD-10-CM | POA: Diagnosis not present

## 2022-11-11 DIAGNOSIS — R42 Dizziness and giddiness: Secondary | ICD-10-CM | POA: Diagnosis not present

## 2022-11-20 DIAGNOSIS — M5417 Radiculopathy, lumbosacral region: Secondary | ICD-10-CM | POA: Diagnosis not present

## 2022-11-20 DIAGNOSIS — M9902 Segmental and somatic dysfunction of thoracic region: Secondary | ICD-10-CM | POA: Diagnosis not present

## 2022-11-20 DIAGNOSIS — M9904 Segmental and somatic dysfunction of sacral region: Secondary | ICD-10-CM | POA: Diagnosis not present

## 2022-11-20 DIAGNOSIS — M9903 Segmental and somatic dysfunction of lumbar region: Secondary | ICD-10-CM | POA: Diagnosis not present

## 2022-11-28 DIAGNOSIS — M9902 Segmental and somatic dysfunction of thoracic region: Secondary | ICD-10-CM | POA: Diagnosis not present

## 2022-11-28 DIAGNOSIS — M9904 Segmental and somatic dysfunction of sacral region: Secondary | ICD-10-CM | POA: Diagnosis not present

## 2022-11-28 DIAGNOSIS — M9903 Segmental and somatic dysfunction of lumbar region: Secondary | ICD-10-CM | POA: Diagnosis not present

## 2022-11-28 DIAGNOSIS — M5417 Radiculopathy, lumbosacral region: Secondary | ICD-10-CM | POA: Diagnosis not present

## 2022-12-11 ENCOUNTER — Other Ambulatory Visit (HOSPITAL_BASED_OUTPATIENT_CLINIC_OR_DEPARTMENT_OTHER): Payer: Self-pay

## 2022-12-12 ENCOUNTER — Other Ambulatory Visit (HOSPITAL_BASED_OUTPATIENT_CLINIC_OR_DEPARTMENT_OTHER): Payer: Self-pay

## 2022-12-12 MED ORDER — INFLUENZA VIRUS VACC SPLIT PF (FLUZONE) 0.5 ML IM SUSY
0.5000 mL | PREFILLED_SYRINGE | Freq: Once | INTRAMUSCULAR | 0 refills | Status: AC
  Filled 2022-12-12: qty 0.5, 1d supply, fill #0

## 2022-12-16 ENCOUNTER — Other Ambulatory Visit: Payer: Self-pay

## 2023-01-06 DIAGNOSIS — M9902 Segmental and somatic dysfunction of thoracic region: Secondary | ICD-10-CM | POA: Diagnosis not present

## 2023-01-06 DIAGNOSIS — M9903 Segmental and somatic dysfunction of lumbar region: Secondary | ICD-10-CM | POA: Diagnosis not present

## 2023-01-06 DIAGNOSIS — M9904 Segmental and somatic dysfunction of sacral region: Secondary | ICD-10-CM | POA: Diagnosis not present

## 2023-01-06 DIAGNOSIS — M5417 Radiculopathy, lumbosacral region: Secondary | ICD-10-CM | POA: Diagnosis not present

## 2023-01-14 DIAGNOSIS — M9903 Segmental and somatic dysfunction of lumbar region: Secondary | ICD-10-CM | POA: Diagnosis not present

## 2023-01-14 DIAGNOSIS — M9902 Segmental and somatic dysfunction of thoracic region: Secondary | ICD-10-CM | POA: Diagnosis not present

## 2023-01-14 DIAGNOSIS — M9904 Segmental and somatic dysfunction of sacral region: Secondary | ICD-10-CM | POA: Diagnosis not present

## 2023-01-14 DIAGNOSIS — M5417 Radiculopathy, lumbosacral region: Secondary | ICD-10-CM | POA: Diagnosis not present

## 2023-01-19 ENCOUNTER — Other Ambulatory Visit: Payer: Self-pay | Admitting: Sports Medicine

## 2023-01-19 ENCOUNTER — Other Ambulatory Visit (HOSPITAL_BASED_OUTPATIENT_CLINIC_OR_DEPARTMENT_OTHER): Payer: Self-pay

## 2023-01-19 DIAGNOSIS — H9042 Sensorineural hearing loss, unilateral, left ear, with unrestricted hearing on the contralateral side: Secondary | ICD-10-CM

## 2023-01-19 MED ORDER — TRIAMTERENE-HCTZ 37.5-25 MG PO CAPS
1.0000 | ORAL_CAPSULE | Freq: Every day | ORAL | 3 refills | Status: DC
Start: 2023-01-19 — End: 2023-05-13
  Filled 2023-01-19: qty 30, 30d supply, fill #0
  Filled 2023-02-17: qty 90, 90d supply, fill #1

## 2023-01-20 ENCOUNTER — Other Ambulatory Visit: Payer: Self-pay | Admitting: Sports Medicine

## 2023-01-20 DIAGNOSIS — Z1231 Encounter for screening mammogram for malignant neoplasm of breast: Secondary | ICD-10-CM

## 2023-01-22 DIAGNOSIS — M9902 Segmental and somatic dysfunction of thoracic region: Secondary | ICD-10-CM | POA: Diagnosis not present

## 2023-01-22 DIAGNOSIS — M9903 Segmental and somatic dysfunction of lumbar region: Secondary | ICD-10-CM | POA: Diagnosis not present

## 2023-01-22 DIAGNOSIS — M5417 Radiculopathy, lumbosacral region: Secondary | ICD-10-CM | POA: Diagnosis not present

## 2023-01-22 DIAGNOSIS — M9904 Segmental and somatic dysfunction of sacral region: Secondary | ICD-10-CM | POA: Diagnosis not present

## 2023-01-28 ENCOUNTER — Ambulatory Visit: Payer: 59

## 2023-01-28 DIAGNOSIS — Z1231 Encounter for screening mammogram for malignant neoplasm of breast: Secondary | ICD-10-CM

## 2023-02-03 DIAGNOSIS — M5417 Radiculopathy, lumbosacral region: Secondary | ICD-10-CM | POA: Diagnosis not present

## 2023-02-03 DIAGNOSIS — M9902 Segmental and somatic dysfunction of thoracic region: Secondary | ICD-10-CM | POA: Diagnosis not present

## 2023-02-03 DIAGNOSIS — M9904 Segmental and somatic dysfunction of sacral region: Secondary | ICD-10-CM | POA: Diagnosis not present

## 2023-02-03 DIAGNOSIS — M9903 Segmental and somatic dysfunction of lumbar region: Secondary | ICD-10-CM | POA: Diagnosis not present

## 2023-02-17 ENCOUNTER — Other Ambulatory Visit: Payer: Self-pay

## 2023-02-20 DIAGNOSIS — M9904 Segmental and somatic dysfunction of sacral region: Secondary | ICD-10-CM | POA: Diagnosis not present

## 2023-02-20 DIAGNOSIS — M5417 Radiculopathy, lumbosacral region: Secondary | ICD-10-CM | POA: Diagnosis not present

## 2023-02-20 DIAGNOSIS — M9903 Segmental and somatic dysfunction of lumbar region: Secondary | ICD-10-CM | POA: Diagnosis not present

## 2023-02-20 DIAGNOSIS — M9902 Segmental and somatic dysfunction of thoracic region: Secondary | ICD-10-CM | POA: Diagnosis not present

## 2023-02-27 DIAGNOSIS — H524 Presbyopia: Secondary | ICD-10-CM | POA: Diagnosis not present

## 2023-03-06 DIAGNOSIS — M9904 Segmental and somatic dysfunction of sacral region: Secondary | ICD-10-CM | POA: Diagnosis not present

## 2023-03-06 DIAGNOSIS — M9903 Segmental and somatic dysfunction of lumbar region: Secondary | ICD-10-CM | POA: Diagnosis not present

## 2023-03-06 DIAGNOSIS — M9902 Segmental and somatic dysfunction of thoracic region: Secondary | ICD-10-CM | POA: Diagnosis not present

## 2023-03-06 DIAGNOSIS — M5417 Radiculopathy, lumbosacral region: Secondary | ICD-10-CM | POA: Diagnosis not present

## 2023-03-20 DIAGNOSIS — M9903 Segmental and somatic dysfunction of lumbar region: Secondary | ICD-10-CM | POA: Diagnosis not present

## 2023-03-20 DIAGNOSIS — M9902 Segmental and somatic dysfunction of thoracic region: Secondary | ICD-10-CM | POA: Diagnosis not present

## 2023-03-20 DIAGNOSIS — M5417 Radiculopathy, lumbosacral region: Secondary | ICD-10-CM | POA: Diagnosis not present

## 2023-03-20 DIAGNOSIS — M9904 Segmental and somatic dysfunction of sacral region: Secondary | ICD-10-CM | POA: Diagnosis not present

## 2023-03-27 DIAGNOSIS — M9904 Segmental and somatic dysfunction of sacral region: Secondary | ICD-10-CM | POA: Diagnosis not present

## 2023-03-27 DIAGNOSIS — M5417 Radiculopathy, lumbosacral region: Secondary | ICD-10-CM | POA: Diagnosis not present

## 2023-03-27 DIAGNOSIS — M9902 Segmental and somatic dysfunction of thoracic region: Secondary | ICD-10-CM | POA: Diagnosis not present

## 2023-03-27 DIAGNOSIS — M9903 Segmental and somatic dysfunction of lumbar region: Secondary | ICD-10-CM | POA: Diagnosis not present

## 2023-04-17 ENCOUNTER — Other Ambulatory Visit (HOSPITAL_COMMUNITY): Payer: Self-pay

## 2023-04-24 DIAGNOSIS — M9902 Segmental and somatic dysfunction of thoracic region: Secondary | ICD-10-CM | POA: Diagnosis not present

## 2023-04-24 DIAGNOSIS — M9904 Segmental and somatic dysfunction of sacral region: Secondary | ICD-10-CM | POA: Diagnosis not present

## 2023-04-24 DIAGNOSIS — M9903 Segmental and somatic dysfunction of lumbar region: Secondary | ICD-10-CM | POA: Diagnosis not present

## 2023-04-24 DIAGNOSIS — M5417 Radiculopathy, lumbosacral region: Secondary | ICD-10-CM | POA: Diagnosis not present

## 2023-04-28 DIAGNOSIS — M9904 Segmental and somatic dysfunction of sacral region: Secondary | ICD-10-CM | POA: Diagnosis not present

## 2023-04-28 DIAGNOSIS — M5417 Radiculopathy, lumbosacral region: Secondary | ICD-10-CM | POA: Diagnosis not present

## 2023-04-28 DIAGNOSIS — M9903 Segmental and somatic dysfunction of lumbar region: Secondary | ICD-10-CM | POA: Diagnosis not present

## 2023-04-28 DIAGNOSIS — M9902 Segmental and somatic dysfunction of thoracic region: Secondary | ICD-10-CM | POA: Diagnosis not present

## 2023-05-08 DIAGNOSIS — M9904 Segmental and somatic dysfunction of sacral region: Secondary | ICD-10-CM | POA: Diagnosis not present

## 2023-05-08 DIAGNOSIS — M5417 Radiculopathy, lumbosacral region: Secondary | ICD-10-CM | POA: Diagnosis not present

## 2023-05-08 DIAGNOSIS — M9902 Segmental and somatic dysfunction of thoracic region: Secondary | ICD-10-CM | POA: Diagnosis not present

## 2023-05-08 DIAGNOSIS — M9903 Segmental and somatic dysfunction of lumbar region: Secondary | ICD-10-CM | POA: Diagnosis not present

## 2023-05-13 ENCOUNTER — Other Ambulatory Visit (HOSPITAL_COMMUNITY): Payer: Self-pay

## 2023-05-13 ENCOUNTER — Other Ambulatory Visit: Payer: Self-pay | Admitting: Sports Medicine

## 2023-05-13 DIAGNOSIS — H9042 Sensorineural hearing loss, unilateral, left ear, with unrestricted hearing on the contralateral side: Secondary | ICD-10-CM

## 2023-05-13 MED ORDER — TRIAMTERENE-HCTZ 37.5-25 MG PO CAPS
1.0000 | ORAL_CAPSULE | Freq: Every day | ORAL | 3 refills | Status: DC
Start: 2023-05-13 — End: 2023-09-21
  Filled 2023-05-13: qty 30, 30d supply, fill #0
  Filled 2023-05-18: qty 90, 90d supply, fill #0
  Filled 2023-08-11: qty 30, 30d supply, fill #1

## 2023-05-15 DIAGNOSIS — M5417 Radiculopathy, lumbosacral region: Secondary | ICD-10-CM | POA: Diagnosis not present

## 2023-05-15 DIAGNOSIS — M9902 Segmental and somatic dysfunction of thoracic region: Secondary | ICD-10-CM | POA: Diagnosis not present

## 2023-05-15 DIAGNOSIS — M9903 Segmental and somatic dysfunction of lumbar region: Secondary | ICD-10-CM | POA: Diagnosis not present

## 2023-05-15 DIAGNOSIS — M9904 Segmental and somatic dysfunction of sacral region: Secondary | ICD-10-CM | POA: Diagnosis not present

## 2023-05-18 ENCOUNTER — Other Ambulatory Visit: Payer: Self-pay

## 2023-05-18 ENCOUNTER — Other Ambulatory Visit (HOSPITAL_COMMUNITY): Payer: Self-pay

## 2023-05-20 ENCOUNTER — Ambulatory Visit (INDEPENDENT_AMBULATORY_CARE_PROVIDER_SITE_OTHER): Payer: 59

## 2023-05-29 DIAGNOSIS — M9902 Segmental and somatic dysfunction of thoracic region: Secondary | ICD-10-CM | POA: Diagnosis not present

## 2023-05-29 DIAGNOSIS — M9903 Segmental and somatic dysfunction of lumbar region: Secondary | ICD-10-CM | POA: Diagnosis not present

## 2023-05-29 DIAGNOSIS — M9904 Segmental and somatic dysfunction of sacral region: Secondary | ICD-10-CM | POA: Diagnosis not present

## 2023-05-29 DIAGNOSIS — M5417 Radiculopathy, lumbosacral region: Secondary | ICD-10-CM | POA: Diagnosis not present

## 2023-07-28 DIAGNOSIS — M5417 Radiculopathy, lumbosacral region: Secondary | ICD-10-CM | POA: Diagnosis not present

## 2023-07-28 DIAGNOSIS — M9904 Segmental and somatic dysfunction of sacral region: Secondary | ICD-10-CM | POA: Diagnosis not present

## 2023-07-28 DIAGNOSIS — M9902 Segmental and somatic dysfunction of thoracic region: Secondary | ICD-10-CM | POA: Diagnosis not present

## 2023-07-28 DIAGNOSIS — M9903 Segmental and somatic dysfunction of lumbar region: Secondary | ICD-10-CM | POA: Diagnosis not present

## 2023-08-11 ENCOUNTER — Other Ambulatory Visit: Payer: Self-pay

## 2023-08-13 DIAGNOSIS — M5417 Radiculopathy, lumbosacral region: Secondary | ICD-10-CM | POA: Diagnosis not present

## 2023-08-13 DIAGNOSIS — M9903 Segmental and somatic dysfunction of lumbar region: Secondary | ICD-10-CM | POA: Diagnosis not present

## 2023-08-13 DIAGNOSIS — M9904 Segmental and somatic dysfunction of sacral region: Secondary | ICD-10-CM | POA: Diagnosis not present

## 2023-08-13 DIAGNOSIS — M9902 Segmental and somatic dysfunction of thoracic region: Secondary | ICD-10-CM | POA: Diagnosis not present

## 2023-08-28 DIAGNOSIS — R5383 Other fatigue: Secondary | ICD-10-CM | POA: Diagnosis not present

## 2023-08-28 DIAGNOSIS — R790 Abnormal level of blood mineral: Secondary | ICD-10-CM | POA: Diagnosis not present

## 2023-08-28 DIAGNOSIS — E559 Vitamin D deficiency, unspecified: Secondary | ICD-10-CM | POA: Diagnosis not present

## 2023-08-28 DIAGNOSIS — N951 Menopausal and female climacteric states: Secondary | ICD-10-CM | POA: Diagnosis not present

## 2023-08-31 LAB — LAB REPORT - SCANNED
A1c: 5.3
EGFR: 84

## 2023-09-21 ENCOUNTER — Other Ambulatory Visit: Payer: Self-pay | Admitting: Sports Medicine

## 2023-09-21 DIAGNOSIS — H9042 Sensorineural hearing loss, unilateral, left ear, with unrestricted hearing on the contralateral side: Secondary | ICD-10-CM

## 2023-09-21 NOTE — Telephone Encounter (Signed)
 Scheduled

## 2023-09-22 NOTE — Telephone Encounter (Signed)
 Copied from CRM [id number]. Topic: Clinical - Prescription Issue >> Sep 22, 2023  2:39 PM Carmell R wrote: Reason for CRM: Pt is out of her triamterene -hydrochlorothiazide  (DYAZIDE ) 37.5-25 MG capsule. She has scheduled her physical with Dr. ONEIDA for 7/22. She was supposed to get a supply sent to the pharmacy to hold her over in the meantime. Pharmacy has not received a rx. Please follow up as the patient is out of medication now.

## 2023-09-23 ENCOUNTER — Other Ambulatory Visit: Payer: Self-pay

## 2023-09-23 ENCOUNTER — Other Ambulatory Visit (HOSPITAL_COMMUNITY): Payer: Self-pay

## 2023-09-23 MED ORDER — TRIAMTERENE-HCTZ 37.5-25 MG PO CAPS
1.0000 | ORAL_CAPSULE | Freq: Every day | ORAL | 0 refills | Status: DC
Start: 2023-09-23 — End: 2023-12-18
  Filled 2023-09-23: qty 90, 90d supply, fill #0

## 2023-09-23 NOTE — Telephone Encounter (Signed)
 Patient has already been scheduled

## 2023-09-25 ENCOUNTER — Other Ambulatory Visit: Payer: Self-pay

## 2023-10-06 ENCOUNTER — Ambulatory Visit (INDEPENDENT_AMBULATORY_CARE_PROVIDER_SITE_OTHER): Admitting: Sports Medicine

## 2023-10-06 ENCOUNTER — Other Ambulatory Visit (HOSPITAL_BASED_OUTPATIENT_CLINIC_OR_DEPARTMENT_OTHER): Payer: Self-pay

## 2023-10-06 VITALS — BP 132/75 | HR 73 | Ht 65.0 in | Wt 134.0 lb

## 2023-10-06 DIAGNOSIS — Z0000 Encounter for general adult medical examination without abnormal findings: Secondary | ICD-10-CM

## 2023-10-06 DIAGNOSIS — Z23 Encounter for immunization: Secondary | ICD-10-CM

## 2023-10-06 DIAGNOSIS — E785 Hyperlipidemia, unspecified: Secondary | ICD-10-CM | POA: Diagnosis not present

## 2023-10-06 MED ORDER — PREDNISONE 50 MG PO TABS
50.0000 mg | ORAL_TABLET | Freq: Every day | ORAL | 0 refills | Status: AC
  Filled 2023-10-06: qty 5, 5d supply, fill #0

## 2023-10-06 MED ORDER — DOXYCYCLINE HYCLATE 100 MG PO TABS
100.0000 mg | ORAL_TABLET | Freq: Two times a day (BID) | ORAL | 0 refills | Status: AC
  Filled 2023-10-06: qty 14, 7d supply, fill #0

## 2023-10-06 NOTE — Addendum Note (Signed)
 Addended by: OLIVA-AVELLANEDA, Kayton Dunaj L on: 10/06/2023 09:41 AM   Modules accepted: Orders

## 2023-10-06 NOTE — Progress Notes (Addendum)
 Subjective:    CC: Annual Physical Exam  HPI:  This patient is here for their annual physical  I reviewed the past medical history, family history, social history, surgical history, and allergies today and no changes were needed.  Please see the problem list section below in epic for further details.  Past Medical History: Past Medical History:  Diagnosis Date   Acute medial meniscus tear of right knee    H/O pyelonephritis 2001 & 2009   Past Surgical History: Past Surgical History:  Procedure Laterality Date   APPENDECTOMY  age 4   KNEE ARTHROSCOPY Right 06/08/2015   Procedure: RIGHT KNEE ARTHROSCOPY WITH DEBRIDEMENT;  Surgeon: Dempsey Sensor, MD;  Location: [facility];  Service: Orthopedics;  Laterality: Right;   Social History: Social History   Socioeconomic History   Marital status: Married    Spouse name: Not on file   Number of children: 2   Years of education: Not on file   Highest education level: Not on file  Occupational History   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[employer]    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, 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    Social Drivers of Corporate investment banker Strain: Not on file  Food Insecurity: Not on file  Transportation Needs: Not on file  Physical Activity: Not on file  Stress: Not on  file  Social Connections: Not on file   Family History: Family History  Problem Relation Age of Onset   Hyperlipidemia Mother    Heart attack Father        cabg x 6   Hypertension Father    Thyroid  disease Father        hypethyroidism   Hyperlipidemia Maternal Grandmother    Cancer Maternal Grandmother        breast   Hyperlipidemia Maternal Grandfather    Hypertension Maternal Grandfather    Heart attack Maternal Grandfather    Cancer Maternal Grandfather        prostate   Heart disease Maternal Grandfather    Stroke Maternal Grandfather    Diabetes Paternal Grandmother    Arthritis Paternal Grandmother    Heart attack Paternal Grandfather    Asthma Neg Hx    Alcohol abuse Neg Hx    Drug abuse Neg Hx    Colon cancer Neg Hx    Colon polyps Neg Hx    Esophageal cancer Neg Hx    Stomach cancer Neg Hx    Rectal cancer Neg Hx    Allergies: Allergies  Allergen Reactions   Tdap [Tetanus-Diphth-Acell Pertussis] Swelling    Lot of swelling and scar tissue at injection site   Medications: See med rec.  Review of Systems: No headache,  visual changes, nausea, vomiting, diarrhea, constipation, dizziness, abdominal pain, skin rash, fevers, chills, night sweats, swollen lymph nodes, weight loss, chest pain, body aches, joint swelling, muscle aches, shortness of breath, mood changes, visual or auditory hallucinations.  Objective:    General: Well Developed, well nourished, and in no acute distress.  Neuro: Alert and oriented x3, extra-ocular muscles intact, sensation grossly intact. Cranial nerves II through XII are intact, motor, sensory, and coordinative functions are all intact. HEENT: Normocephalic, atraumatic, pupils equal round reactive to light, neck supple, no masses, no lymphadenopathy, thyroid  nonpalpable. Oropharynx, nasopharynx, external ear canals are unremarkable. Skin: Warm and dry, no rashes noted.  Cardiac: Regular rate and rhythm, no murmurs rubs or gallops.   Respiratory: Clear to auscultation bilaterally. Not using accessory muscles, speaking in full sentences.  Abdominal: Soft, nontender, nondistended, positive bowel sounds, no masses, no organomegaly.  Musculoskeletal: Shoulder, elbow, wrist, hip, knee, ankle stable, and with full range of motion.  Impression and Recommendations:    The patient was counselled, risk factors were discussed, anticipatory guidance given.  Annual physical exam Fasting annual physical as above. She did get labs drawn at an outside facility, she will screenshot these on the portal and send them to me in MyChart. She is however up-to-date on colon cancer screening, cervical cancer screening, breast cancer screening. We have encouraged regular exercise and resistance training. Return to see me in a year.  ____________________________________________ [name], M.D., ABFM., CAQSM., AME. Primary Care and Sports Medicine [facility]  Adjunct Professor of [facility] Medicine  [employer]  Restaurant manager, fast food

## 2023-10-06 NOTE — Assessment & Plan Note (Signed)
 Fasting annual physical as above. She did get labs drawn at an outside facility, she will screenshot these on the portal and send them to me in MyChart. She is however up-to-date on colon cancer screening, cervical cancer screening, breast cancer screening. We have encouraged regular exercise and resistance training. Return to see me in a year.

## 2023-10-09 ENCOUNTER — Encounter: Payer: Self-pay | Admitting: Sports Medicine

## 2023-10-09 NOTE — Assessment & Plan Note (Signed)
 [name] had her labs done at an outside facility, they are available in the most recent MyChart message as an attachment. Lipids were elevated.

## 2023-11-17 ENCOUNTER — Encounter: Payer: Self-pay | Admitting: Sports Medicine

## 2023-12-14 ENCOUNTER — Other Ambulatory Visit: Payer: Self-pay

## 2023-12-14 DIAGNOSIS — N959 Unspecified menopausal and perimenopausal disorder: Secondary | ICD-10-CM | POA: Diagnosis not present

## 2023-12-14 DIAGNOSIS — E559 Vitamin D deficiency, unspecified: Secondary | ICD-10-CM | POA: Diagnosis not present

## 2023-12-14 DIAGNOSIS — R7989 Other specified abnormal findings of blood chemistry: Secondary | ICD-10-CM | POA: Diagnosis not present

## 2023-12-14 DIAGNOSIS — R5383 Other fatigue: Secondary | ICD-10-CM | POA: Diagnosis not present

## 2023-12-14 DIAGNOSIS — E785 Hyperlipidemia, unspecified: Secondary | ICD-10-CM | POA: Diagnosis not present

## 2023-12-16 ENCOUNTER — Other Ambulatory Visit (HOSPITAL_COMMUNITY): Payer: Self-pay

## 2023-12-17 ENCOUNTER — Other Ambulatory Visit (HOSPITAL_COMMUNITY): Payer: Self-pay

## 2023-12-18 ENCOUNTER — Other Ambulatory Visit: Payer: Self-pay

## 2023-12-18 ENCOUNTER — Other Ambulatory Visit: Payer: Self-pay | Admitting: Medical-Surgical

## 2023-12-18 ENCOUNTER — Other Ambulatory Visit (HOSPITAL_COMMUNITY): Payer: Self-pay

## 2023-12-18 DIAGNOSIS — H9042 Sensorineural hearing loss, unilateral, left ear, with unrestricted hearing on the contralateral side: Secondary | ICD-10-CM

## 2023-12-18 NOTE — Telephone Encounter (Signed)
 Copied from CRM [id number]. Topic: Clinical - Medication Refill >> Dec 18, 2023  9:48 AM Myrick T wrote: Medication: triamterene -hydrochlorothiazide  (DYAZIDE ) 37.5-25 MG capsule   Has the patient contacted their pharmacy? No  This is the patient's preferred pharmacy:  [facility] [place] [facility] [address] [place] [address] Phone: [phone] Fax: [phone]  Is this the correct pharmacy for this prescription? Yes  Has the prescription been filled recently? Yes  Is the patient out of the medication? Yes  Has the patient been seen for an appointment in the last year OR does the patient have an upcoming appointment? Yes  Can we respond through MyChart? Yes  Agent: Please be advised that Rx refills may take up to 3 business days. We ask that you follow-up with your pharmacy.

## 2023-12-22 ENCOUNTER — Other Ambulatory Visit: Payer: Self-pay

## 2023-12-22 ENCOUNTER — Other Ambulatory Visit (HOSPITAL_BASED_OUTPATIENT_CLINIC_OR_DEPARTMENT_OTHER): Payer: Self-pay

## 2023-12-22 ENCOUNTER — Other Ambulatory Visit (HOSPITAL_COMMUNITY): Payer: Self-pay

## 2023-12-22 MED ORDER — TRIAMTERENE-HCTZ 37.5-25 MG PO CAPS
1.0000 | ORAL_CAPSULE | Freq: Every day | ORAL | 0 refills | Status: DC
  Filled 2023-12-22 (×2): qty 90, 90d supply, fill #0

## 2023-12-23 ENCOUNTER — Other Ambulatory Visit: Payer: Self-pay

## 2023-12-23 ENCOUNTER — Other Ambulatory Visit (HOSPITAL_BASED_OUTPATIENT_CLINIC_OR_DEPARTMENT_OTHER): Payer: Self-pay

## 2023-12-23 MED ORDER — FLUZONE 0.5 ML IM SUSY
0.5000 mL | PREFILLED_SYRINGE | Freq: Once | INTRAMUSCULAR | 0 refills | Status: AC
  Filled 2023-12-23: qty 0.5, 1d supply, fill #0

## 2023-12-24 ENCOUNTER — Other Ambulatory Visit (HOSPITAL_COMMUNITY): Payer: Self-pay

## 2023-12-24 ENCOUNTER — Other Ambulatory Visit: Payer: Self-pay

## 2023-12-24 ENCOUNTER — Other Ambulatory Visit (HOSPITAL_BASED_OUTPATIENT_CLINIC_OR_DEPARTMENT_OTHER): Payer: Self-pay

## 2023-12-24 MED ORDER — ESTRADIOL 0.025 MG/24HR TD PTTW
1.0000 | MEDICATED_PATCH | TRANSDERMAL | 2 refills | Status: AC
  Filled 2023-12-24 (×2): qty 8, 28d supply, fill #0
  Filled 2024-01-22: qty 8, 28d supply, fill #1

## 2023-12-24 MED ORDER — PROGESTERONE MICRONIZED 100 MG PO CAPS
100.0000 mg | ORAL_CAPSULE | Freq: Every evening | ORAL | 2 refills | Status: DC
  Filled 2023-12-24 (×2): qty 30, 30d supply, fill #0
  Filled 2024-01-22: qty 30, 30d supply, fill #1
  Filled 2024-02-15 (×2): qty 30, 30d supply, fill #2

## 2024-01-22 ENCOUNTER — Other Ambulatory Visit: Payer: Self-pay

## 2024-01-22 ENCOUNTER — Other Ambulatory Visit (HOSPITAL_BASED_OUTPATIENT_CLINIC_OR_DEPARTMENT_OTHER): Payer: Self-pay

## 2024-01-22 ENCOUNTER — Other Ambulatory Visit (HOSPITAL_COMMUNITY): Payer: Self-pay

## 2024-02-15 ENCOUNTER — Other Ambulatory Visit (HOSPITAL_COMMUNITY): Payer: Self-pay

## 2024-02-15 ENCOUNTER — Other Ambulatory Visit: Payer: Self-pay

## 2024-02-16 ENCOUNTER — Other Ambulatory Visit (HOSPITAL_COMMUNITY): Payer: Self-pay

## 2024-02-18 ENCOUNTER — Other Ambulatory Visit: Payer: Self-pay

## 2024-02-18 MED ORDER — ESTRADIOL 0.025 MG/24HR TD PTTW
1.0000 | MEDICATED_PATCH | TRANSDERMAL | 0 refills | Status: AC
  Filled 2024-03-03: qty 24, 84d supply, fill #0

## 2024-02-18 MED ORDER — PROGESTERONE MICRONIZED 100 MG PO CAPS
100.0000 mg | ORAL_CAPSULE | Freq: Every day | ORAL | 0 refills | Status: DC
  Filled 2024-03-03 – 2024-03-11 (×2): qty 90, 90d supply, fill #0

## 2024-02-19 ENCOUNTER — Other Ambulatory Visit (HOSPITAL_COMMUNITY): Payer: Self-pay

## 2024-03-03 ENCOUNTER — Other Ambulatory Visit: Payer: Self-pay

## 2024-03-04 ENCOUNTER — Other Ambulatory Visit (HOSPITAL_COMMUNITY): Payer: Self-pay

## 2024-03-07 ENCOUNTER — Other Ambulatory Visit: Payer: Self-pay | Admitting: Family Medicine

## 2024-03-07 DIAGNOSIS — H9042 Sensorineural hearing loss, unilateral, left ear, with unrestricted hearing on the contralateral side: Secondary | ICD-10-CM

## 2024-03-08 DIAGNOSIS — R5383 Other fatigue: Secondary | ICD-10-CM | POA: Diagnosis not present

## 2024-03-08 DIAGNOSIS — N959 Unspecified menopausal and perimenopausal disorder: Secondary | ICD-10-CM | POA: Diagnosis not present

## 2024-03-08 DIAGNOSIS — R7989 Other specified abnormal findings of blood chemistry: Secondary | ICD-10-CM | POA: Diagnosis not present

## 2024-03-08 DIAGNOSIS — E785 Hyperlipidemia, unspecified: Secondary | ICD-10-CM | POA: Diagnosis not present

## 2024-03-09 ENCOUNTER — Other Ambulatory Visit (HOSPITAL_COMMUNITY): Payer: Self-pay

## 2024-03-09 ENCOUNTER — Other Ambulatory Visit: Payer: Self-pay

## 2024-03-09 MED ORDER — TRIAMTERENE-HCTZ 37.5-25 MG PO CAPS
1.0000 | ORAL_CAPSULE | Freq: Every day | ORAL | 0 refills | Status: AC
  Filled 2024-03-09: qty 90, 90d supply, fill #0

## 2024-03-11 ENCOUNTER — Other Ambulatory Visit: Payer: Self-pay

## 2024-03-11 ENCOUNTER — Other Ambulatory Visit (HOSPITAL_COMMUNITY): Payer: Self-pay

## 2024-03-28 ENCOUNTER — Other Ambulatory Visit (HOSPITAL_BASED_OUTPATIENT_CLINIC_OR_DEPARTMENT_OTHER): Payer: Self-pay

## 2024-03-28 MED ORDER — PROGESTERONE MICRONIZED 100 MG PO CAPS
100.0000 mg | ORAL_CAPSULE | Freq: Every day | ORAL | 1 refills | Status: AC
  Filled 2024-03-28: qty 90, 90d supply, fill #0

## 2024-03-28 MED ORDER — ESTRADIOL 0.025 MG/24HR TD PTTW
1.0000 | MEDICATED_PATCH | TRANSDERMAL | 0 refills | Status: AC
  Filled 2024-03-28: qty 24, 84d supply, fill #0

## 2024-05-03 ENCOUNTER — Encounter: Admitting: Urgent Care
